# Patient Record
Sex: Female | Born: 1970 | Race: White | Hispanic: No | Marital: Married | Smoking: Never smoker
Health system: Southern US, Community
[De-identification: ages and names within clinical notes are randomized; demographics above are authoritative.]

## PROBLEM LIST (undated history)

## (undated) DIAGNOSIS — O039 Complete or unspecified spontaneous abortion without complication: Secondary | ICD-10-CM

## (undated) DIAGNOSIS — M1990 Unspecified osteoarthritis, unspecified site: Secondary | ICD-10-CM

## (undated) DIAGNOSIS — Z803 Family history of malignant neoplasm of breast: Secondary | ICD-10-CM

## (undated) DIAGNOSIS — N809 Endometriosis, unspecified: Secondary | ICD-10-CM

## (undated) DIAGNOSIS — D0512 Intraductal carcinoma in situ of left breast: Secondary | ICD-10-CM

## (undated) DIAGNOSIS — I10 Essential (primary) hypertension: Secondary | ICD-10-CM

## (undated) DIAGNOSIS — O1490 Unspecified pre-eclampsia, unspecified trimester: Secondary | ICD-10-CM

## (undated) DIAGNOSIS — Z8041 Family history of malignant neoplasm of ovary: Secondary | ICD-10-CM

## (undated) HISTORY — DX: Family history of malignant neoplasm of breast: Z80.3

## (undated) HISTORY — PX: BIOPSY ENDOMETRIAL: PRO11

## (undated) HISTORY — DX: Complete or unspecified spontaneous abortion without complication: O03.9

## (undated) HISTORY — DX: Unspecified osteoarthritis, unspecified site: M19.90

## (undated) HISTORY — PX: WISDOM TOOTH EXTRACTION: SHX21

## (undated) HISTORY — DX: Intraductal carcinoma in situ of left breast: D05.12

## (undated) HISTORY — DX: Essential (primary) hypertension: I10

## (undated) HISTORY — DX: Unspecified pre-eclampsia, unspecified trimester: O14.90

## (undated) HISTORY — DX: Endometriosis, unspecified: N80.9

## (undated) HISTORY — DX: Family history of malignant neoplasm of ovary: Z80.41

---

## 2002-02-17 HISTORY — PX: CHOLECYSTECTOMY: SHX55

## 2004-05-15 ENCOUNTER — Ambulatory Visit: Payer: Self-pay | Admitting: Internal Medicine

## 2005-06-19 ENCOUNTER — Ambulatory Visit: Payer: Self-pay | Admitting: Obstetrics & Gynecology

## 2005-08-28 ENCOUNTER — Ambulatory Visit: Payer: Self-pay | Admitting: Obstetrics & Gynecology

## 2006-03-09 ENCOUNTER — Ambulatory Visit: Payer: Self-pay | Admitting: Family Medicine

## 2006-07-23 ENCOUNTER — Encounter (INDEPENDENT_AMBULATORY_CARE_PROVIDER_SITE_OTHER): Payer: Self-pay | Admitting: Gynecology

## 2006-07-23 ENCOUNTER — Ambulatory Visit: Payer: Self-pay | Admitting: Obstetrics & Gynecology

## 2007-08-19 ENCOUNTER — Ambulatory Visit: Payer: Self-pay | Admitting: Obstetrics & Gynecology

## 2007-09-15 ENCOUNTER — Emergency Department (HOSPITAL_COMMUNITY): Admission: EM | Admit: 2007-09-15 | Discharge: 2007-09-15 | Payer: Self-pay | Admitting: Emergency Medicine

## 2008-02-14 ENCOUNTER — Encounter: Payer: Self-pay | Admitting: Family Medicine

## 2008-02-14 ENCOUNTER — Ambulatory Visit: Payer: Self-pay | Admitting: Family Medicine

## 2009-08-15 ENCOUNTER — Ambulatory Visit: Payer: Self-pay | Admitting: Obstetrics & Gynecology

## 2009-08-22 ENCOUNTER — Encounter: Payer: Self-pay | Admitting: Obstetrics & Gynecology

## 2009-08-22 ENCOUNTER — Ambulatory Visit: Payer: Self-pay | Admitting: Family Medicine

## 2009-08-22 LAB — CONVERTED CEMR LAB
HDL: 43 mg/dL (ref >39)
LDL Cholesterol: 110 mg/dL — ABNORMAL HIGH (ref 0–99)
TSH: 0.95 microintl units/mL (ref 0.350–4.500)
VLDL: 34 mg/dL (ref 0–40)

## 2010-02-22 ENCOUNTER — Ambulatory Visit: Payer: Self-pay | Admitting: Internal Medicine

## 2010-07-02 NOTE — Assessment & Plan Note (Signed)
NAMERUQAYYA, VENTRESS NO.:  [id number]   MEDICAL RECORD NO.:  [id number]          PATIENT TYPE:  POB   LOCATION:  [facility] at [facility]         FACILITY:  [facility]   PHYSICIAN:  Elsie Lincoln, MD      DATE OF BIRTH:  [birth year]   DATE OF SERVICE:                                  CLINIC NOTE   HISTORY OF PRESENT ILLNESS:  The patient is a 40 year old female who  presents for vaginal irritation.  She has been having pain and  irritation with sex for 2 months and then externally she is having  irritation for about a week.  There is itching.  There is no odor and no  discharge.   PAST MEDICAL HISTORY:  Denies all problems.   PAST GYN HISTORY:  Pelvic endometriosis.  Uses condoms for birth  control.   PAST SURGICAL HISTORY:  Laparoscopy for endometriosis, D&C, and a C-  section.   MEDICATIONS:  None.   ALLERGIES:  Penicillin and erythromycin.   PHYSICAL EXAMINATION:  VITAL SIGNS:  Pulse 74, blood pressure 120/80,  weight 211.  GENERAL:  Well nourished, well developed, in no apparent distress.  ABDOMEN:  Soft, nontender, and slightly obese.  GENITALIA:  Completely shaved and mild red erythema between 6 and 11  o'clock near the vestibule and vagina.  Discharge from the cervix  consistent with ovulation and the patient states she is ovulating.  Cervix closed.  Nontender.  No evidence of yeast inside the vagina.   ASSESSMENT AND PLAN:  A 40 year old female with vaginal irritation.  1. Apply Monistat to the outside of the vulva daily for a week.  2. Stop shaving till she returns for yearly exam to see if it helps      irritation.  3. Hydrocortisone p.r.n. and Monistat.  4. GC, Chlamydia, and wet prep present.  5. Diflucan to treat for external yeast.  6. Return to the clinic for yearly exam in 2 weeks.           ______________________________  Elsie Lincoln, MD     KL/MEDQ  D:  08/19/2007  T:  08/20/2007  Job:  [id number]

## 2010-07-02 NOTE — Assessment & Plan Note (Signed)
NAMEREBECKAH, Jane [name] NO.:  [id number]   MEDICAL RECORD NO.:  [id number]          PATIENT TYPE:  POB   LOCATION:  [facility]         FACILITY:  [facility]   PHYSICIAN:  Jaynie Collins, MD     DATE OF BIRTH:  Jun 03, 1970   DATE OF SERVICE:  08/15/2009                                  CLINIC NOTE   REASON FOR VISIT:  Yearly examination.   The patient is a 40 year old gravida 2, para 1-0-1-1 who comes in today  for her yearly examination.  The patient denies any gynecologic concerns  or issues.   PAST OB/GYN HISTORY:  She is a gravida 2, para 1-0-1-1, one cesarean  section and one miscarriage requiring a D&C.  She has a history of  endometriosis which are managed with NSAIDs and she had been on oral  contraceptive pills in the past but none currently.  No history of  abnormal Pap smears or sexually transmitted infections.  Her last Pap  smear was in December 2009.   PAST MEDICAL HISTORY:  Endometriosis and pinched nerve in her back.   PAST SURGICAL HISTORY:  Laparoscopy for her endometriosis, laparoscopic  cholecystectomy, a D&C for miscarriage, and an emergent cesarean section  via an infraumbilical vertical incision.   MEDICATIONS:  Vitamin D.   ALLERGIES:  PENICILLIN, ERYTHROMYCIN which both cause hives and ADHESIVE  which causes blisters and infection.   SOCIAL HISTORY:  The patient is a Agricultural consultant for [employer].  She denies  any tobacco, alcohol, or illicit drug use.  She denies any past or  current history of sexual abuse or physical abuse.   FAMILY HISTORY:  She has a paternal aunt that died of ovarian cancer.  Another paternal aunt who survived postmenopausal breast cancer.  There  is a maternal grandfather with a history of colon cancer and heart  disease and she denies any other family conditions or diseases.   REVIEW OF SYSTEMS:  A 14-point review of systems is discussed with the  patient and is entirely negative.   PHYSICAL EXAMINATION:   VITAL SIGNS:  Blood pressure 129/85, pulse 66,  weight 210 pounds, height 5 feet 2 inches.  GENERAL:  No apparent distress.  HEENT:  Normocephalic, atraumatic.  NECK:  Supple.  No masses.  Normal thyroid.  LUNGS:  Clear to auscultation bilaterally.  HEART:  Regular rate and rhythm.  BREASTS:  Symmetric, soft, nontender.  No abnormal skin changes, masses,  nipple drainage, or lymphadenopathy.  ABDOMEN:  Soft, nontender, nondistended.  No organomegaly.  EXTREMITIES:  No cyanosis, clubbing, or edema.  Distal pulses are 2+.  PELVIC:  Normal external female genitalia.  Pink, well-rugated vagina.  Nulliparous cervical os without lesion.  Uterus is small and anteverted.  No tenderness on examination.  No adnexal tenderness but adnexa were  unable to be palpated secondary to patient's habitus.   ASSESSMENT AND PLAN:  The patient is a 40 year old gravida 2, para 1-0-1-  1 here for her annual examination.  She had a normal breast examination.  We will obtain a routine mammogram today and this appointment will be  made with the patient after this encounter.  She was told that she  can  get mammograms every 2 years as needed until she is 49 at which point,  we will get yearly mammograms.  The patient does have a history of  endometriosis but is not requiring any further intervention at this  point.  She says that the occasional analgesia helps for her pain.  The  patient will come back at a later date to have her fasting lipid profile  and TSH checked, and we will follow up on these results.  The patient  was told to call back for any further gynecologic concerns.           ______________________________  Jaynie Collins, MD     UA/MEDQ  D:  08/15/2009  T:  08/16/2009  Job:  [id number]

## 2010-07-05 NOTE — Group Therapy Note (Signed)
Jane [name], VACCARO NO.:  [id number]   MEDICAL RECORD NO.:  [id number]          PATIENT TYPE:  POB   LOCATION:  [facility]                   FACILITY:  [facility]   PHYSICIAN:  Elsie Lincoln, MD      DATE OF BIRTH:  [birth year]   DATE OF SERVICE:  08/28/2005                                    CLINIC NOTE   This patient was seen at [facility].   The patient is here for followup of her results.   The patient has probable endometriosis and was having increased pelvic pain.  We started her on Loestrin 24 her last visit, however, she is having a lot  of bleeding with this so we are going to up her estrogen and also change her  progestin secondary to breast complaints. We have given her sample of Yasmin  and also some prescriptions. She also was having nausea so she changed to  nighttime dosing and is doing better with that. She has a family history of  rheumatoid arthritis and is having increasing stiffness. She is not going to  the rheumatologist yet as she is waiting for her new insurance; however, her  rheumatoid factor was less than 20. She is not due for a mammogram and her  Pap smear in May was negative. The patient is to followup in 6 months to see  how she is doing with her pelvic pain and see how the new Yasmin is doing  with her bleeding and pain. If she needs to be sooner, she will make an  appointment.           ______________________________  Elsie Lincoln, MD     KL/MEDQ  D:  08/28/2005  T:  08/28/2005  Job:  [id number]

## 2010-07-05 NOTE — Group Therapy Note (Signed)
[name], [name] NO.:  [id number]   MEDICAL RECORD NO.:  [id number]          PATIENT TYPE:  WOC   LOCATION:  [facility]                   FACILITY:  [facility]   PHYSICIAN:  Elsie Lincoln, MD      DATE OF BIRTH:  10/21/1970   DATE OF SERVICE:  06/19/2005                                    CLINIC NOTE   The patient is a 40 year old G2 para 1-0-1-1, LMP 05/30/2005, who presents  for her annual exam.  She also has a new complaint in the past year of  increased pain with menstrual cycles and also dyspareunia.  She has always  experienced cramping with the first day of flow; however, this past year,  she has had some brownish discharge a couple of days before and then  cramping on the first day, and on the second and third day had increased  cramping.  The pain is relieved with Motrin; however, this is different for  her.  She is also having pain after intercourse.  She just has a dull ache  deep inside after intercourse.  She has been diagnosed by laparoscopy in the  1990s with endometriosis.  She believes that this is back.  She was  controlled with continuous OCPs back in the 1990s for her endometriosis and  thinks this would be a good choice for her again.  She also complains of  increased bleeding and clots while on her menstrual flow, and the clots are  painful as they pass.  Currently, she uses condoms for birth control and her  husband is to get a vasectomy.   MEDICAL HISTORY:  She has increased arthritic pain, and her grandmother was  diagnosed with rheumatoid arthritis.  She also thinks her father may have it  as well.  It is reasonable for her to get a rheumatology consult to see if  she has this as well.  She has increased stiffness in her hands and  shoulders, and it takes a while and need for heat in order for her joints to  become pliable.   SURGICAL HISTORY:  No change.   ALLERGIES:  Penicillin, amoxicillin, erythromycin, and adhesive.   Last Pap  smear 05/13/2004.  Last mammogram 03/09/2002.   LIMITATIONS:  None.   REVIEW OF SYMPTOMS:  Positive for dyspareunia.   PHYSICAL EXAMINATION:  GENERAL:  Well nourished, well developed, no apparent  distress.  VITAL SIGNS:  Pulse 67, blood pressure 127/86, weight 205.  HEENT:  Normocephalic, atraumatic.  Oropharynx is clear.  THYROID:  No masses.  LUNGS:  Clear to auscultation bilaterally.  HEART:  Regular rate and rhythm.  BREASTS:  No masses.  No nipple discharge or skin changes.  No  lymphadenopathy.  ABDOMEN:  Soft, nontender, nondistended.  No organomegaly.  No hernia.  No  lymphadenopathy.  GENITALIA:  Tanner V.  Vagina pink, normal rugae.  Urethra:  No prolapse or  tenderness.  Bladder nontender.  Cervix:  Nulliparous, closed, nontender.  Uterus difficult to palpate secondary to body habitus; however, nontender.  Adnexa:  No masses, nontender.  Perineum intact.  EXTREMITIES:  No edema.   ASSESSMENT  AND PLAN:  A 40 year old female for her physical.  1.  Pap smear.  2.  Dysmenorrhea and dyspareunia probably secondary to endometriosis.  We      will get a transvaginal ultrasound to rule out any pelvic pathology, but      in the meantime, we will start her on continuous OCPs.  The patient was      given a sample of Loestrin Fe 24 and she is to throw out the placebo.      She was also given a prescription for a year's supply.  3.  Rheumatology referral.  4.  Return to clinic in 8 weeks to go over results.           ______________________________  Elsie Lincoln, MD     KL/MEDQ  D:  06/19/2005  T:  06/19/2005  Job:  [id number]

## 2010-09-11 ENCOUNTER — Ambulatory Visit: Payer: Self-pay | Admitting: Family Medicine

## 2010-11-15 LAB — URINALYSIS, ROUTINE W REFLEX MICROSCOPIC
Bilirubin Urine: NEGATIVE
Nitrite: NEGATIVE
Protein, ur: NEGATIVE
Specific Gravity, Urine: 1.012
Urobilinogen, UA: 0.2

## 2011-01-07 ENCOUNTER — Ambulatory Visit: Payer: Self-pay | Admitting: Internal Medicine

## 2012-11-17 ENCOUNTER — Ambulatory Visit: Payer: Self-pay | Admitting: Obstetrics and Gynecology

## 2013-11-21 ENCOUNTER — Ambulatory Visit: Payer: Self-pay | Admitting: Obstetrics and Gynecology

## 2014-01-20 ENCOUNTER — Ambulatory Visit: Payer: Self-pay | Admitting: Emergency Medicine

## 2014-04-18 IMAGING — US ULTRASOUND RIGHT BREAST
1 series · 5 of 5 positions shown · non-contrast
Comparison: No comparison exams available (patient reportedly has
her screening mammograms done at an outside facility).

ACR Breast Density Category c: The breast tissue is heterogeneously
dense.

REASON FOR EXAM: RT BR FIRMNESS AREOLA
COMMENTS:

PROCEDURE:     MAM - MAM US BREAST RIGHT  - November 17, 2012  [DATE]
CLINICAL DATA: Palpable right breast mass.
EXAM:
DIGITAL DIAGNOSTIC rightMAMMOGRAM
ULTRASOUND right BREAST

[Series 1: ultrasound right breast · 0.08mm/px · 5 of 5 slices shown]
[im 1/5]
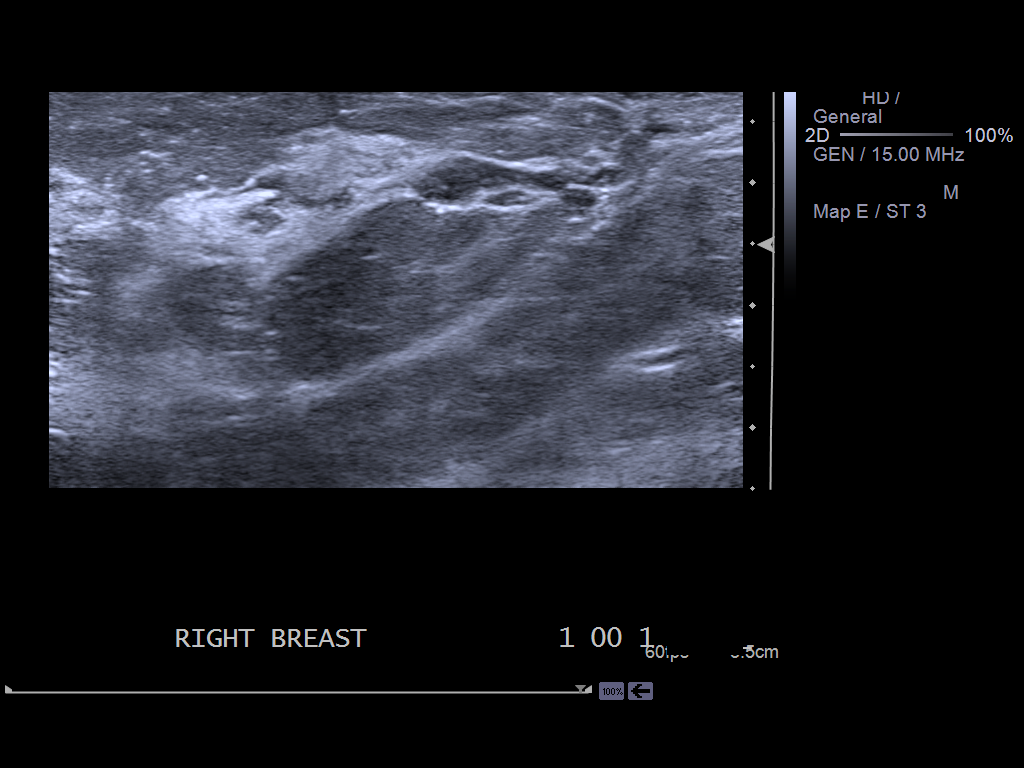
[im 2/5]
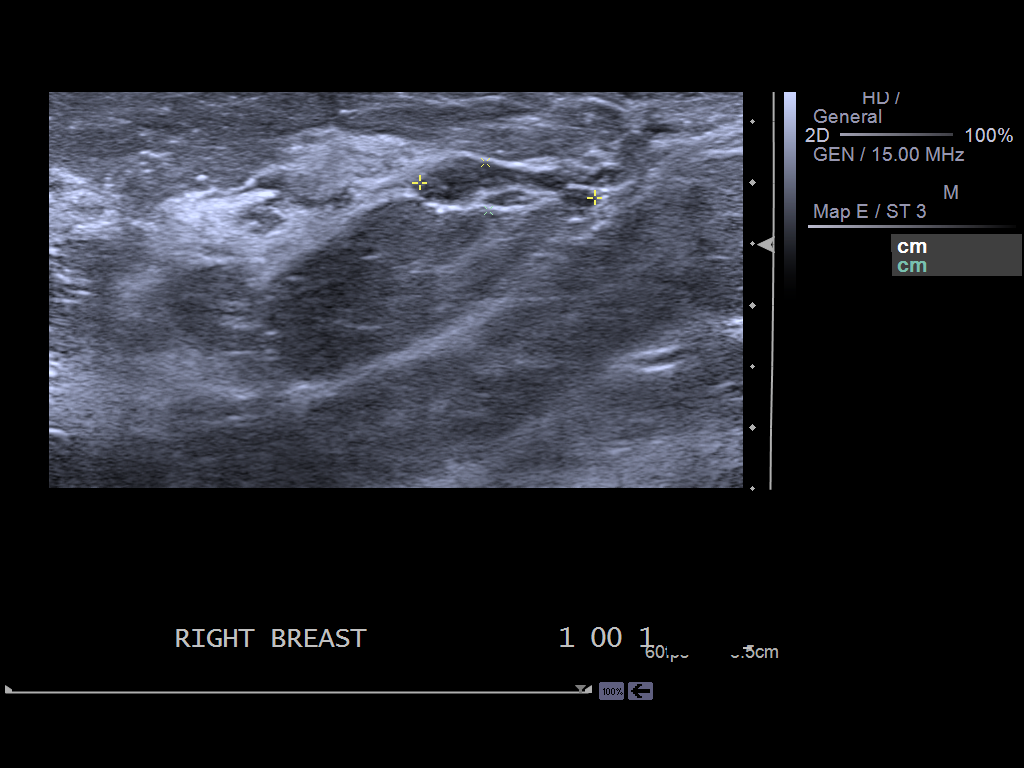
[im 3/5]
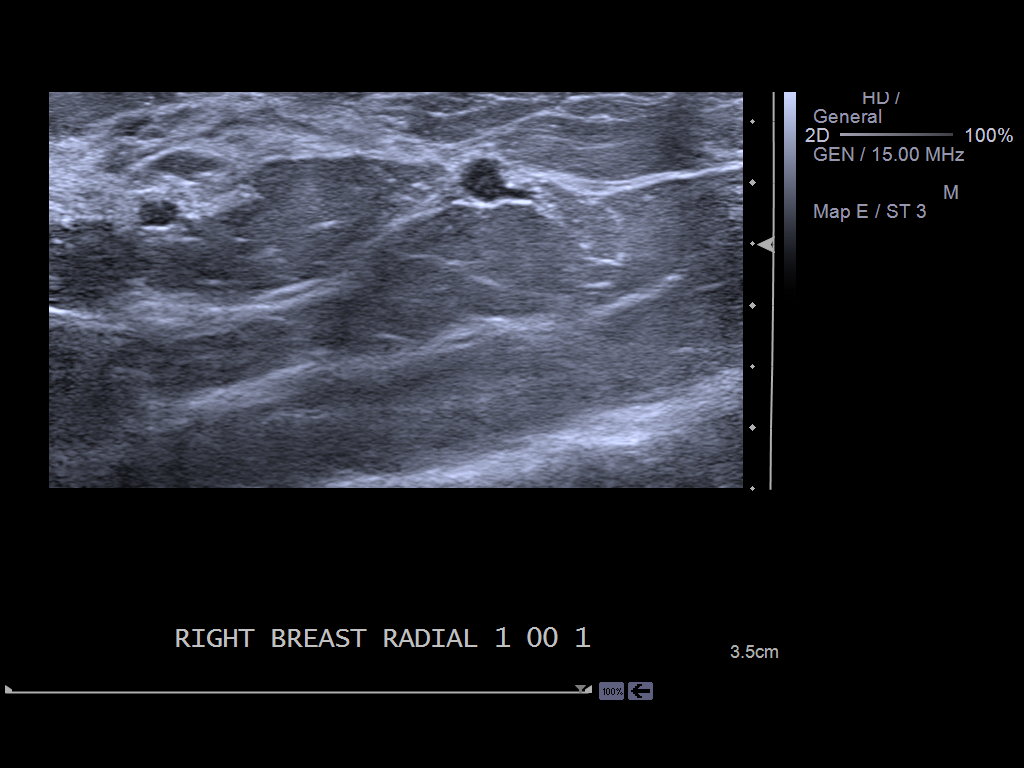
[im 4/5]
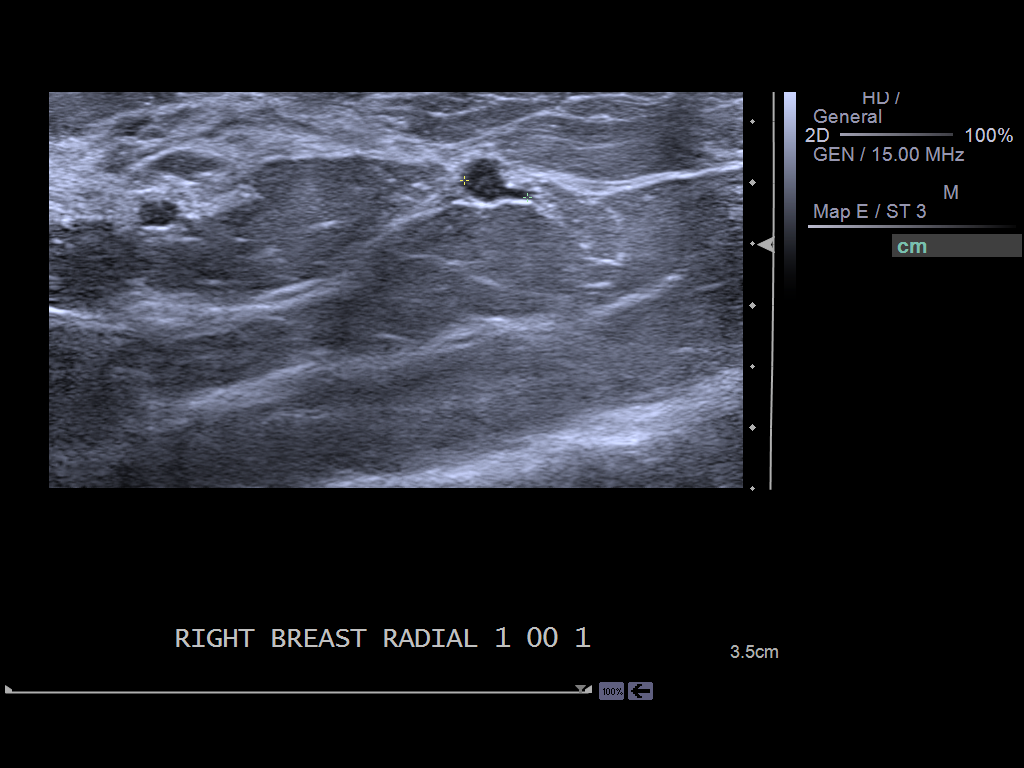
[im 5/5]
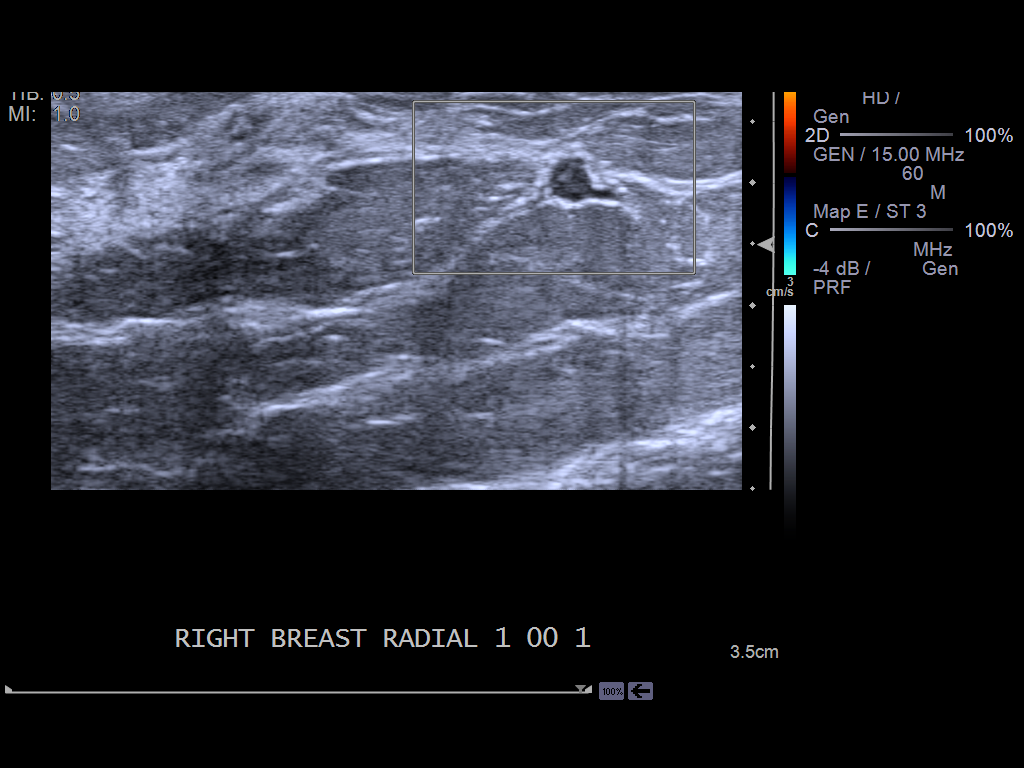

[5 of 5 positions shown; findings below may reference images not displayed]

FINDINGS: No suspicious masses or calcifications are seen in the right breast.
No mammographic abnormalities are seen to correlate with the area of
palpable concern in the right breast.

Mammographic images were processed with CAD.

Physical examination at site of palpable concern in the periareolar
right breast at the approximate 12 to 1 o'clock position does not
reveal any palpable masses.

Targeted ultrasound of the right breast was performed demonstrating
a tubular hypoechoic mass in the right breast at 1 o'clock 1 cm from
the nipple measuring 1.4 x 0.4 x 0.5 cm.
IMPRESSION: Probably benign right breast mass, possibly related to a mildly
dilated duct, less likely an intramammary lymph node.

RECOMMENDATION:
A six-month followup right breast ultrasound is recommended to
demonstrate stability of the probably benign periareolar right
breast mass.

I have discussed the findings and recommendations with the patient.
Results were also provided in writing at the conclusion of the
visit. If applicable, a reminder letter will be sent to the patient
regarding the next appointment.

BI-RADS CATEGORY  3: Probably benign finding(s) - short interval
follow-up suggested.

## 2015-01-18 ENCOUNTER — Other Ambulatory Visit: Payer: Self-pay | Admitting: Obstetrics and Gynecology

## 2015-01-18 DIAGNOSIS — N63 Unspecified lump in unspecified breast: Secondary | ICD-10-CM

## 2015-02-01 ENCOUNTER — Other Ambulatory Visit: Payer: Self-pay

## 2015-02-01 ENCOUNTER — Ambulatory Visit: Payer: Self-pay

## 2015-04-03 LAB — HM MAMMOGRAPHY

## 2015-04-03 LAB — HM PAP SMEAR: HM Pap smear: NEGATIVE

## 2015-04-22 IMAGING — US US BREAST*R* LIMITED INC AXILLA
1 series · 4 of 4 positions shown · non-contrast
Comparison: Right diagnostic mammogram dated 11/17/2012.

CLINICAL DATA: 43-year-old female for follow-up of probably benign
right breast finding

EXAM:
DIGITAL DIAGNOSTIC  BILATERAL MAMMOGRAM WITH CAD
ULTRASOUND RIGHT BREAST

[Series 1: us breast*right* limited inc axilla · 0.08mm/px · 4 of 4 slices shown]
[im 1/4]
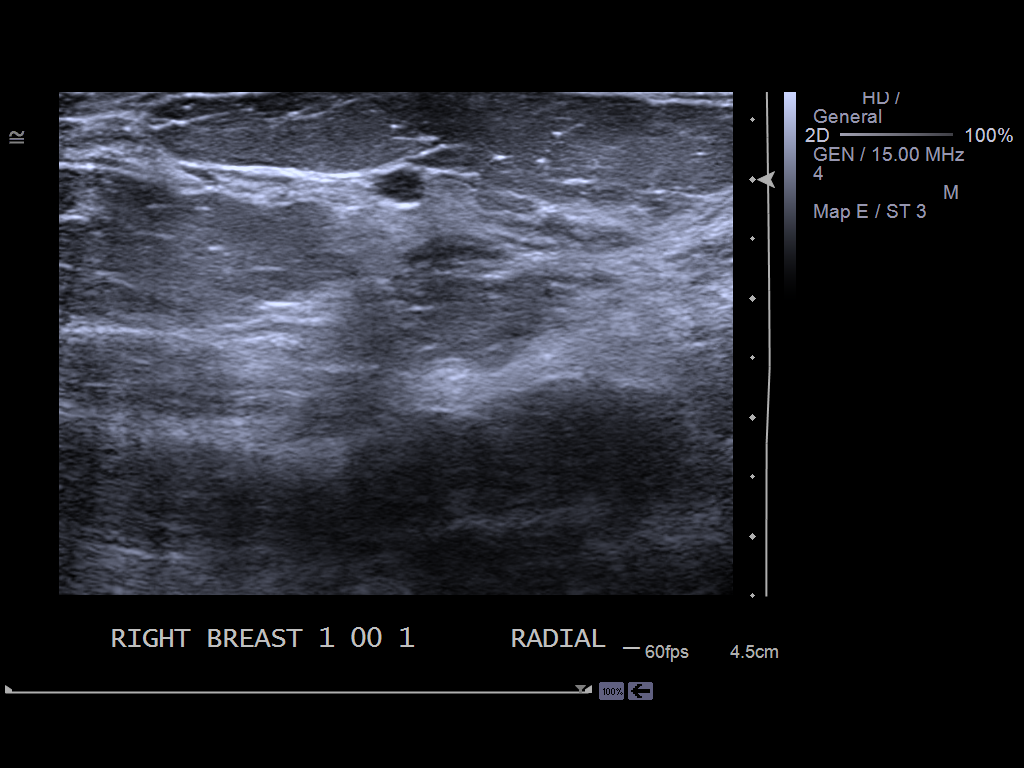
[im 2/4]
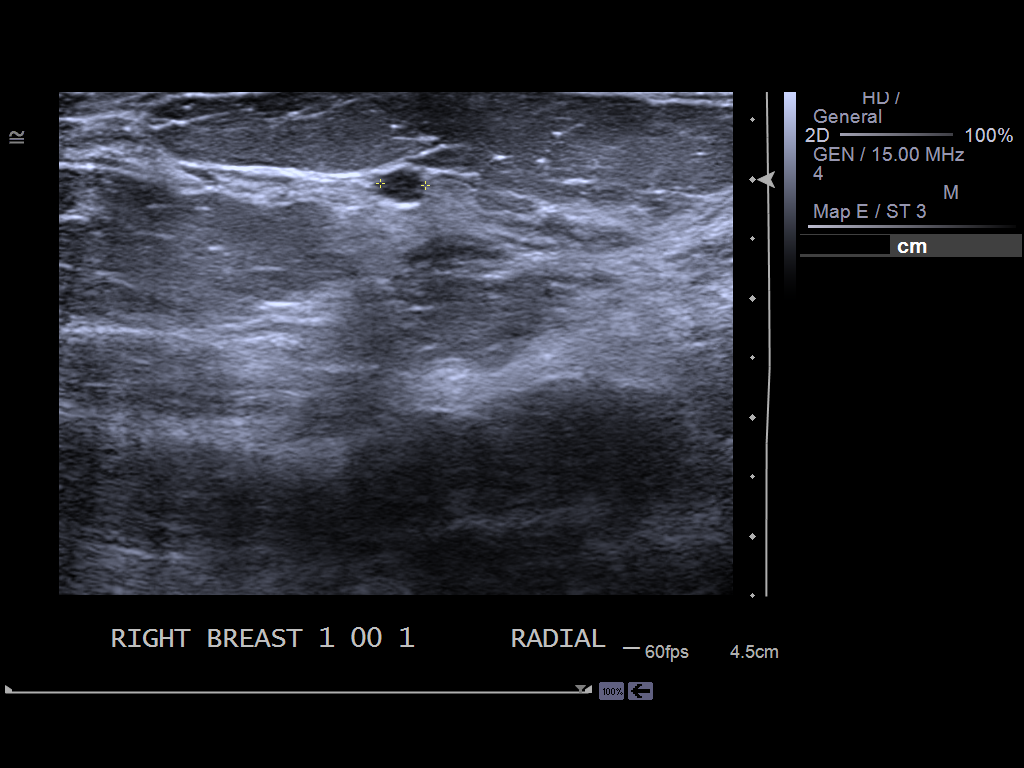
[im 3/4]
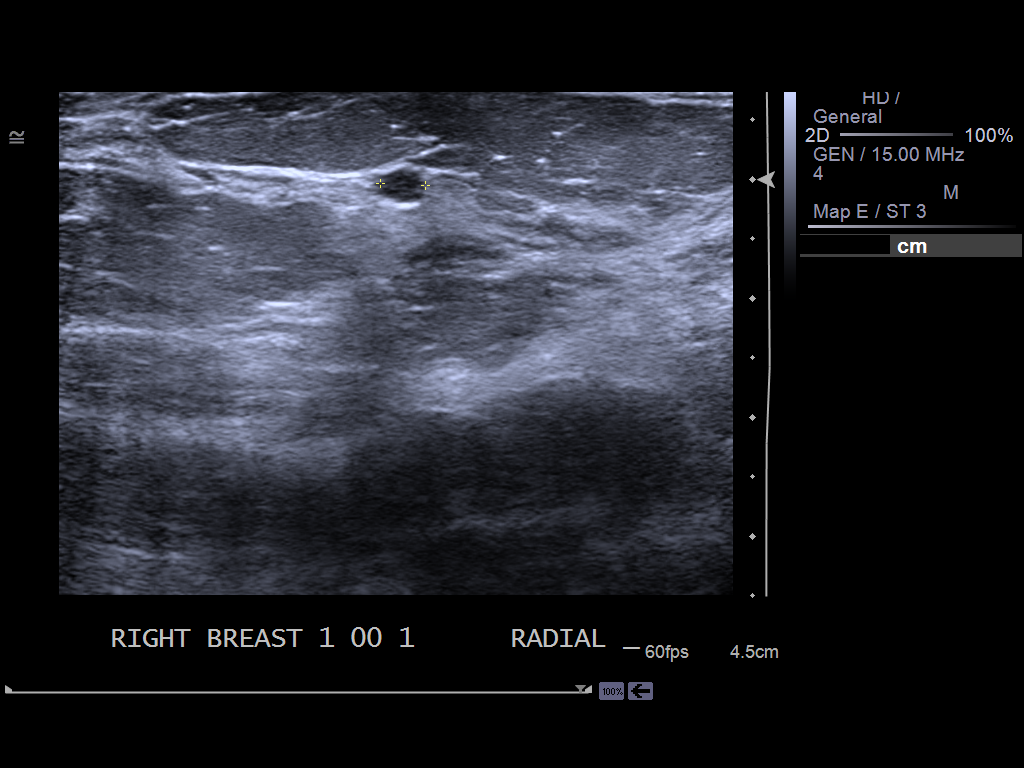
[im 4/4]
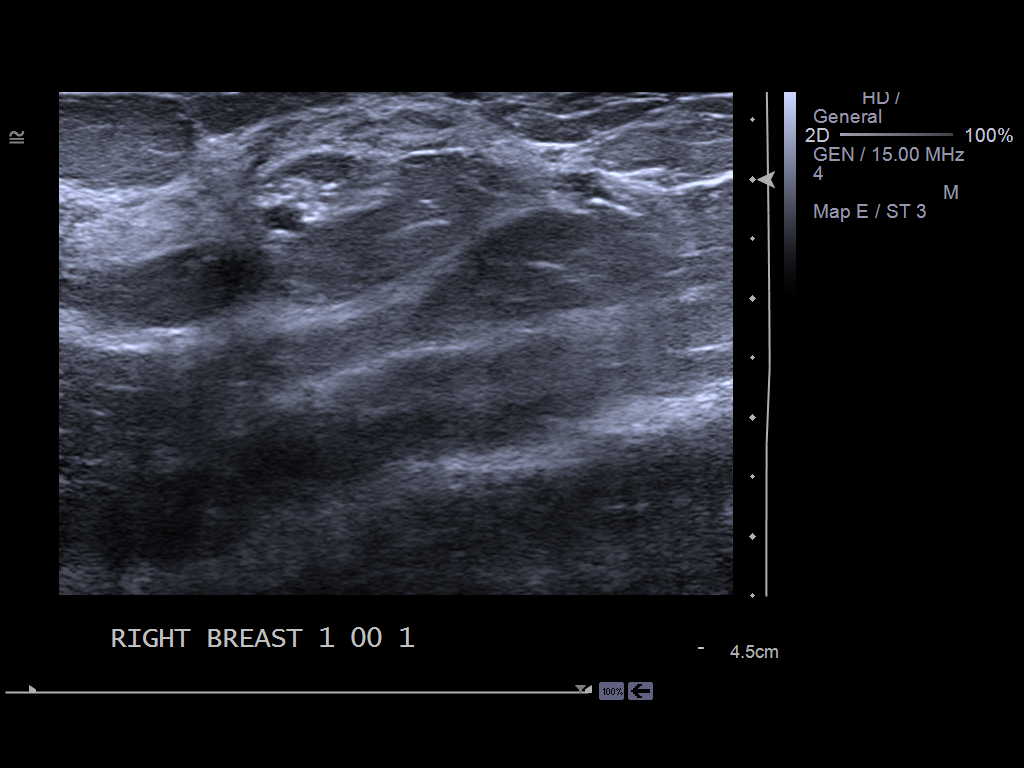

[4 of 4 positions shown; findings below may reference images not displayed]

Outside
screening exams dated 05/04/2013 and 01/30/2012. These images were
not available at the time of the patient's exam.

ACR Breast Density Category c: The breast tissue is heterogeneously
dense, which may obscure small masses.
FINDINGS: Targeted ultrasound of the region of the previously seen hypoechoic
mass at 1 o'clock, 1 cm from the nipple demonstrates no significant
interval change in sonographic appearance of this region. Precise
comparison is difficult due to the tubular nature of the mass and
differences in imaging technique.

An asymmetry is noted within the subareolar right breast on the CC
spot compression view only. This appears less prominent on the
rolled CC views. Targeted ultrasound of the central right breast
demonstrates no suspicious cystic or solid sonographic finding. No
definite sonographic correlate is identified.

Mammographic images were processed with CAD.
IMPRESSION: Probably benign right breast asymmetry and mass at 1 o'clock, 1 cm
from the nipple.

RECOMMENDATION:
Right diagnostic mammogram and ultrasound in 6 months.

I have discussed the findings and recommendations with the patient.
Results were also provided in writing at the conclusion of the
visit. If applicable, a reminder letter will be sent to the patient
regarding the next appointment.

BI-RADS CATEGORY  3: Probably benign.

## 2016-11-12 ENCOUNTER — Ambulatory Visit: Payer: Self-pay | Admitting: Advanced Practice Midwife

## 2016-11-28 ENCOUNTER — Encounter: Payer: Self-pay | Admitting: Obstetrics and Gynecology

## 2016-11-28 ENCOUNTER — Ambulatory Visit (INDEPENDENT_AMBULATORY_CARE_PROVIDER_SITE_OTHER): Payer: Self-pay | Admitting: Obstetrics and Gynecology

## 2016-11-28 VITALS — BP 128/88 | Ht 61.0 in | Wt 224.0 lb

## 2016-11-28 DIAGNOSIS — N914 Secondary oligomenorrhea: Secondary | ICD-10-CM

## 2016-11-28 DIAGNOSIS — N760 Acute vaginitis: Secondary | ICD-10-CM

## 2016-11-28 LAB — POCT WET PREP WITH KOH
CLUE CELLS WET PREP PER HPF POC: NEGATIVE
KOH Prep POC: NEGATIVE
PH, VAGINAL: 4.5
Trichomonas, UA: NEGATIVE

## 2016-11-28 MED ORDER — MEDROXYPROGESTERONE ACETATE 10 MG PO TABS: 10.0000 mg | ORAL_TABLET | Freq: Every day | ORAL | 0 refills | Status: DC

## 2016-11-28 NOTE — Progress Notes (Signed)
Obstetrics & Gynecology Office Visit   Chief Complaint  Patient presents with  . Vaginal Discharge    History of Present Illness: Vaginitis: Patient complains of an abnormal vaginal discharge for 1 week. Vaginal symptoms include discharge described as mucusy and odor.Vulvar symptoms include burning and local irritation.STI Risk: Very low risk of STD exposureDischarge described as: mucoid.Other associated symptoms: none.Menstrual pattern: She had been bleeding infrequently. Contraception: none. She has had no menses since July. She has about 2 menses per year. When they do come they are quite heavy. She does endorse some symptoms of menopause such as hot flashes.   Past Medical History:  Diagnosis Date  . Arthritis   . Endometriosis   . Hypertension   . Pre-eclampsia   . Spontaneous abortion    Past surgical history: C-section Cholecystectomy Diagnostic laparoscopy  Gynecologic History: Patient's last menstrual period was 07/29/2016.  Obstetric History: [id number]  Family History  Problem Relation Age of Onset  . Kidney disease Father   . Breast cancer Maternal Aunt   . Ovarian cancer Maternal Aunt   . Colon cancer Maternal Grandfather   . Heart disease Maternal Grandfather     Social History   Social History  . Marital status: Married    Spouse name: N/A  . Number of children: N/A  . Years of education: N/A   Occupational History  . Not on file.   Social History Main Topics  . Smoking status: Never Smoker  . Smokeless tobacco: Never Used  . Alcohol use No  . Drug use: No  . Sexual activity: Yes    Birth control/ protection: Surgical   Other Topics Concern  . Not on file   Social History Narrative  . No narrative on file    Allergies  Allergen Reactions  . Erythromycin Rash  . Penicillins Rash  . Amoxicillin Hives    Prior to Admission medications   Medication Sig Start Date End Date Taking? Authorizing Provider  lisinopril (PRINIVIL,ZESTRIL) 20 MG  tablet Take 20 mg by mouth daily. 11/19/16   [provider]    Review of Systems  Constitutional: Negative.   HENT: Negative.   Eyes: Negative.   Respiratory: Negative.   Cardiovascular: Negative.   Gastrointestinal: Negative.   Genitourinary: Negative.        See HPI  Musculoskeletal: Negative.   Skin: Negative.   Neurological: Negative.   Psychiatric/Behavioral: Negative.      Physical Exam BP 128/88   Ht  (1.549 m)   Wt 224 lb (101.6 kg)   LMP 07/29/2016   BMI 42.32 kg/m  Patient's last menstrual period was 07/29/2016. Physical Exam  Constitutional: She is oriented to person, place, and time. She appears well-developed and well-nourished. No distress.  Genitourinary: Vagina normal and uterus normal. Pelvic exam was performed with patient supine. There is no rash, tenderness or lesion on the right labia. There is no rash, tenderness or lesion on the left labia. Right adnexum does not display mass, does not display tenderness and does not display fullness. Left adnexum does not display mass, does not display tenderness and does not display fullness. Cervix does not exhibit motion tenderness, lesion, discharge or polyp.   Uterus is mobile. Uterus is not enlarged or tender.  Cardiovascular: Normal rate and regular rhythm.   Pulmonary/Chest: Effort normal and breath sounds normal.  Abdominal: Soft. Bowel sounds are normal. She exhibits no distension and no mass. There is no tenderness. There is no rebound and no guarding.  Musculoskeletal: Normal range of motion. She exhibits no edema or tenderness.  Neurological: She is alert and oriented to person, place, and time.  Skin: She is not diaphoretic.  Psychiatric: She has a normal mood and affect. Her behavior is normal. Judgment normal.   Female chaperone present for pelvic and breast  portions of the physical exam  Wet Prep: PH: 4.5 Clue Cells: Negative Fungal elements: Negative Trichomonas:  Negative  Assessment: 46 y.o. [id number] female here for  1. Acute vaginitis   2. Secondary oligomenorrhea      Plan: Problem List Items Addressed This Visit    None    Visit Diagnoses    Acute vaginitis    -  Primary   No evidence of BV, trichomonas, or yeast infection. Continue to monitor.   Relevant Orders   POCT Wet Prep with KOH (Completed)   Secondary oligomenorrhea       Provera 10 mg po x 10 days to affect withdrawal bleed.  If heavy, recommend she does this more often as she may have an estrogen deficiency. If lighter, no trx   Relevant Medications   medroxyPROGESTERone (PROVERA) 10 MG tablet     Thomasene Mohair, MD 11/28/2016 11:56 AM

## 2016-12-31 ENCOUNTER — Ambulatory Visit: Payer: Self-pay | Admitting: Obstetrics & Gynecology

## 2017-01-17 DIAGNOSIS — Z8041 Family history of malignant neoplasm of ovary: Secondary | ICD-10-CM

## 2017-01-17 HISTORY — DX: Family history of malignant neoplasm of ovary: Z80.41

## 2017-01-19 ENCOUNTER — Encounter: Payer: Self-pay | Admitting: Obstetrics & Gynecology

## 2017-01-19 ENCOUNTER — Ambulatory Visit (INDEPENDENT_AMBULATORY_CARE_PROVIDER_SITE_OTHER): Payer: BLUE CROSS/BLUE SHIELD | Admitting: Obstetrics & Gynecology

## 2017-01-19 VITALS — BP 102/60 | HR 87 | Ht 61.0 in | Wt 226.0 lb

## 2017-01-19 DIAGNOSIS — Z1231 Encounter for screening mammogram for malignant neoplasm of breast: Secondary | ICD-10-CM | POA: Diagnosis not present

## 2017-01-19 DIAGNOSIS — Z01419 Encounter for gynecological examination (general) (routine) without abnormal findings: Secondary | ICD-10-CM

## 2017-01-19 DIAGNOSIS — Z0000 Encounter for general adult medical examination without abnormal findings: Secondary | ICD-10-CM | POA: Insufficient documentation

## 2017-01-19 DIAGNOSIS — Z1239 Encounter for other screening for malignant neoplasm of breast: Secondary | ICD-10-CM

## 2017-01-19 NOTE — Progress Notes (Signed)
HPI:      [name] is a 46 y.o. [phone] who LMP was Patient's last menstrual period was 12/05/2016., she presents today for her annual examination. The patient has no complaints today. The patient is sexually active. Her last pap: approximate date 2017 (Feb) and was normal and last mammogram: approximate date 2017 and was normal. The patient does perform self breast exams.  There is notable family history of breast or ovarian cancer in her family.  The patient has regular exercise: yes.  The patient denies current symptoms of depression.   Last period April 2018; took Provera in Oct to see if would have period and it was painful and not desirable.  No BTB. Rare hot flash.  C/o WEIGHT GAIn, FATIGUE, and MEMORY LAPSES.  GYN History: Contraception: vasectomy  PMHx: Past Medical History:  Diagnosis Date  . Arthritis   . Endometriosis   . Hypertension   . Pre-eclampsia   . Spontaneous abortion    History reviewed. No pertinent surgical history. Family History  Problem Relation Age of Onset  . Kidney disease Father   . Breast cancer Maternal Aunt   . Ovarian cancer Maternal Aunt   . Colon cancer Maternal Grandfather   . Heart disease Maternal Grandfather    Social History   Tobacco Use  . Smoking status: Never Smoker  . Smokeless tobacco: Never Used  Substance Use Topics  . Alcohol use: No  . Drug use: No    Current Outpatient Medications:  .  lisinopril (PRINIVIL,ZESTRIL) 20 MG tablet, Take 20 mg by mouth daily., Disp: , Rfl: 3 .  medroxyPROGESTERone (PROVERA) 10 MG tablet, Take 1 tablet (10 mg total) by mouth daily., Disp: 10 tablet, Rfl: 0 Allergies: Erythromycin; Penicillins; and Amoxicillin  Review of Systems  Constitutional: Negative for chills, fever and malaise/fatigue.  HENT: Negative for congestion, sinus pain and sore throat.   Eyes: Negative for blurred vision and pain.  Respiratory: Negative for cough and wheezing.   Cardiovascular: Negative for  chest pain and leg swelling.  Gastrointestinal: Negative for abdominal pain, constipation, diarrhea, heartburn, nausea and vomiting.  Genitourinary: Negative for dysuria, frequency, hematuria and urgency.  Musculoskeletal: Negative for back pain, joint pain, myalgias and neck pain.  Skin: Negative for itching and rash.  Neurological: Negative for dizziness, tremors and weakness.  Endo/Heme/Allergies: Does not bruise/bleed easily.  Psychiatric/Behavioral: Negative for depression. The patient is not nervous/anxious and does not have insomnia.     Objective: BP 102/60   Pulse 87   Ht 5\' 1"  (1.549 m)   Wt 226 lb (102.5 kg)   LMP 12/05/2016   BMI 42.70 kg/m   Filed Weights   01/19/17 1532  Weight: 226 lb (102.5 kg)   Body mass index is 42.7 kg/m. Physical Exam  Constitutional: She is oriented to person, place, and time. She appears well-developed and well-nourished. No distress.  Genitourinary: Rectum normal, vagina normal and uterus normal. Pelvic exam was performed with patient supine. There is no rash or lesion on the right labia. There is no rash or lesion on the left labia. Vagina exhibits no lesion. No bleeding in the vagina. Right adnexum does not display mass and does not display tenderness. Left adnexum does not display mass and does not display tenderness. Cervix does not exhibit motion tenderness, lesion, friability or polyp.   Uterus is mobile and midaxial. Uterus is not enlarged or exhibiting a mass.  HENT:  Head: Normocephalic and atraumatic. Head is without laceration.  Right Ear: Hearing normal.  Left Ear: Hearing normal.  Nose: No epistaxis.  No foreign bodies.  Mouth/Throat: Uvula is midline, oropharynx is clear and moist and mucous membranes are normal.  Eyes: Pupils are equal, round, and reactive to light.  Neck: Normal range of motion. Neck supple. No thyromegaly present.  Cardiovascular: Normal rate and regular rhythm. Exam reveals no gallop and no friction rub.    No murmur heard. Pulmonary/Chest: Effort normal and breath sounds normal. No respiratory distress. She has no wheezes. Right breast exhibits no mass, no skin change and no tenderness. Left breast exhibits no mass, no skin change and no tenderness.  Abdominal: Soft. Bowel sounds are normal. She exhibits no distension. There is no tenderness. There is no rebound.  Musculoskeletal: Normal range of motion.  Neurological: She is alert and oriented to person, place, and time. No cranial nerve deficit.  Skin: Skin is warm and dry.  Psychiatric: She has a normal mood and affect. Judgment normal.  Vitals reviewed.   Assessment:  ANNUAL EXAM 1. Screening for breast cancer   2. Annual physical exam   3. Morbid obesity (HCC)     Screening Plan:            1.  Cervical Screening-  Pap smear schedule reviewed with patient  2. Breast screening- Exam annually and mammogram>40 planned   3. Colonoscopy every 10 years, Hemoccult testing - after age 47  4. Labs managed by PCP  5. Counseling for contraception: vasectomy   6. Morbid obesity (HCC) Weight loss counseled along w exercise and dietary changes.  Meds to consider.   7. Oligomenorrhea, monitor as may be menopause.  If BTB then consider US or EMB.    F/U  Return in about 1 year (around 01/19/2018) for Annual.  Annamarie MajorPaul Emoni Yang, MD, Merlinda FrederickFACOG [facility], [facility] 01/19/2017  4:07 PM

## 2017-01-19 NOTE — Patient Instructions (Signed)
PAP every three years Mammogram every year    Call [phone] to schedule at [place] Colonoscopy every 10 years after age 47 Labs yearly (with PCP) 

## 2017-02-04 ENCOUNTER — Encounter: Payer: Self-pay | Admitting: Obstetrics and Gynecology

## 2017-03-04 ENCOUNTER — Other Ambulatory Visit: Payer: Self-pay | Admitting: *Deleted

## 2017-03-04 ENCOUNTER — Inpatient Hospital Stay
Admission: RE | Admit: 2017-03-04 | Discharge: 2017-03-04 | Disposition: A | Discharge status: Home / Self Care | Payer: Self-pay | Source: Ambulatory Visit | Attending: *Deleted | Admitting: *Deleted

## 2017-03-04 DIAGNOSIS — Z9289 Personal history of other medical treatment: Secondary | ICD-10-CM

## 2017-03-30 ENCOUNTER — Ambulatory Visit
Admission: RE | Admit: 2017-03-30 | Discharge: 2017-03-30 | Disposition: A | Discharge status: Home / Self Care | Payer: BLUE CROSS/BLUE SHIELD | Source: Ambulatory Visit | Attending: Obstetrics & Gynecology | Admitting: Obstetrics & Gynecology

## 2017-03-30 ENCOUNTER — Other Ambulatory Visit: Payer: Self-pay | Admitting: Obstetrics & Gynecology

## 2017-03-30 ENCOUNTER — Encounter: Payer: Self-pay | Admitting: Radiology

## 2017-03-30 DIAGNOSIS — Z1239 Encounter for other screening for malignant neoplasm of breast: Secondary | ICD-10-CM

## 2017-03-30 DIAGNOSIS — Z1231 Encounter for screening mammogram for malignant neoplasm of breast: Secondary | ICD-10-CM | POA: Diagnosis present

## 2017-03-30 DIAGNOSIS — R921 Mammographic calcification found on diagnostic imaging of breast: Secondary | ICD-10-CM

## 2017-03-30 DIAGNOSIS — R928 Other abnormal and inconclusive findings on diagnostic imaging of breast: Secondary | ICD-10-CM

## 2017-04-09 ENCOUNTER — Ambulatory Visit
Admission: RE | Admit: 2017-04-09 | Discharge: 2017-04-09 | Disposition: A | Discharge status: Home / Self Care | Payer: BLUE CROSS/BLUE SHIELD | Source: Ambulatory Visit | Attending: Obstetrics & Gynecology | Admitting: Obstetrics & Gynecology

## 2017-04-09 ENCOUNTER — Telehealth: Payer: Self-pay | Admitting: Obstetrics & Gynecology

## 2017-04-09 ENCOUNTER — Other Ambulatory Visit: Payer: Self-pay | Admitting: Obstetrics & Gynecology

## 2017-04-09 DIAGNOSIS — R921 Mammographic calcification found on diagnostic imaging of breast: Secondary | ICD-10-CM

## 2017-04-09 DIAGNOSIS — R928 Other abnormal and inconclusive findings on diagnostic imaging of breast: Secondary | ICD-10-CM

## 2017-04-09 NOTE — Telephone Encounter (Signed)
Im assuming she needs Mammo results?

## 2017-04-09 NOTE — Progress Notes (Signed)
LM

## 2017-04-09 NOTE — Telephone Encounter (Signed)
Pt is calling for results. Please advise

## 2017-04-10 ENCOUNTER — Encounter: Payer: Self-pay | Admitting: Obstetrics & Gynecology

## 2017-04-10 NOTE — Progress Notes (Signed)
LM

## 2017-04-10 NOTE — Telephone Encounter (Signed)
Patient is calling to follow up with Dr. Tiburcio PeaHarris. Best time to reach patient is 12 pm- 1 pm on her lunch break. Thank you

## 2017-04-13 NOTE — Telephone Encounter (Signed)
Please advise 

## 2017-04-16 ENCOUNTER — Ambulatory Visit
Admission: RE | Admit: 2017-04-16 | Discharge: 2017-04-16 | Disposition: A | Discharge status: Home / Self Care | Payer: BLUE CROSS/BLUE SHIELD | Source: Ambulatory Visit | Attending: Obstetrics & Gynecology | Admitting: Obstetrics & Gynecology

## 2017-04-16 DIAGNOSIS — R921 Mammographic calcification found on diagnostic imaging of breast: Secondary | ICD-10-CM | POA: Insufficient documentation

## 2017-04-16 DIAGNOSIS — R928 Other abnormal and inconclusive findings on diagnostic imaging of breast: Secondary | ICD-10-CM | POA: Diagnosis not present

## 2017-04-16 DIAGNOSIS — D0512 Intraductal carcinoma in situ of left breast: Secondary | ICD-10-CM | POA: Insufficient documentation

## 2017-04-16 HISTORY — PX: BREAST BIOPSY: SHX20

## 2017-04-20 ENCOUNTER — Telehealth: Payer: Self-pay

## 2017-04-20 LAB — SURGICAL PATHOLOGY

## 2017-04-20 NOTE — Telephone Encounter (Signed)
Pt needs a surgical  Referral. The breast center called , pt is aware of results from breast biopsy.

## 2017-04-21 ENCOUNTER — Other Ambulatory Visit: Payer: Self-pay | Admitting: Obstetrics & Gynecology

## 2017-04-21 ENCOUNTER — Encounter: Payer: Self-pay | Admitting: *Deleted

## 2017-04-21 DIAGNOSIS — D0510 Intraductal carcinoma in situ of unspecified breast: Secondary | ICD-10-CM

## 2017-04-21 NOTE — Telephone Encounter (Signed)
Referral made.  I have d/w pt.

## 2017-04-22 ENCOUNTER — Encounter: Payer: Self-pay | Admitting: *Deleted

## 2017-04-22 ENCOUNTER — Inpatient Hospital Stay: Payer: Self-pay

## 2017-04-22 ENCOUNTER — Ambulatory Visit: Payer: BLUE CROSS/BLUE SHIELD | Admitting: General Surgery

## 2017-04-22 VITALS — BP 118/72 | HR 83 | Resp 14 | Ht 61.0 in | Wt 220.0 lb

## 2017-04-22 DIAGNOSIS — D0512 Intraductal carcinoma in situ of left breast: Secondary | ICD-10-CM

## 2017-04-22 NOTE — Patient Instructions (Signed)
The patient is aware to call back for any questions or concerns.  

## 2017-04-22 NOTE — Progress Notes (Signed)
Patient ID: Jane [name], female   DOB: May 16, 1970, 47 y.o.   MRN: [id number]  Chief Complaint  Patient presents with  . Breast Problem    HPI Jane [name] is a 47 y.o. female.  who presents for a breast evaluation. The most recent mammogram and left breast ultrasound was done on 04-16-17. Left breast biopsy was 04-16-17 showing DCIS. Patient does perform regular self breast checks and gets regular mammograms done.   She could not feel anything different in the breast. She had mastitis 3 times after her son. She is here with her husband, Jane [name] of 27 years. (CT at [facility]) She works at [employer].  HPI  Past Medical History:  Diagnosis Date  . Arthritis   . 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 Endometriosis   . Family history of breast cancer   . Family history of ovarian cancer 01/2017   genetic testing letter sent  . Hypertension   . Pre-eclampsia   . Spontaneous abortion     Past Surgical History:  Procedure Laterality Date  . BIOPSY ENDOMETRIAL    . BREAST BIOPSY Left 04/16/2017   Affirm Bx- path HIGH-GRADE DUCTAL CARCINOMA IN SITU WITH CALCIFICATIONS AND COMEDONECROSIS  . CESAREAN SECTION    . CHOLECYSTECTOMY  2004  . WISDOM TOOTH EXTRACTION      Family History  Problem Relation Age of Onset  . Kidney disease Father   . Colon cancer Maternal Grandfather   . Heart disease Maternal Grandfather   . Breast cancer Paternal Aunt 10  . Ovarian cancer Paternal Aunt 58  . Breast cancer Paternal Aunt 105  . Ovarian cancer Paternal Aunt 42  . Colon cancer Paternal Uncle 49    Social History Social History   Tobacco Use  . Smoking status: Never Smoker  . Smokeless tobacco: Never Used  Substance Use Topics  . Alcohol use: Yes    Comment: rare  . Drug use: No    Allergies  Allergen Reactions  . Erythromycin Rash  . Penicillins Rash  . Adhesive [Tape] Other (See Comments)    infection  . Amoxicillin Hives    Current  Outpatient Medications  Medication Sig Dispense Refill  . lisinopril (PRINIVIL,ZESTRIL) 20 MG tablet Take 20 mg by mouth daily.  3   No current facility-administered medications for this visit.     Review of Systems Review of Systems  Constitutional: Negative.   Respiratory: Negative.   Cardiovascular: Negative.     Blood pressure 118/72, pulse 83, resp. rate 14, height 5\' 1"  (1.549 m), weight 220 lb (99.8 kg), last menstrual period 06/06/2016, SpO2 97 %.  Physical Exam Physical Exam  Constitutional: She is oriented to person, place, and time. She appears well-developed and well-nourished.  HENT:  Mouth/Throat: Oropharynx is clear and moist.  Eyes: Conjunctivae are normal. No scleral icterus.  Neck: Neck supple.  Cardiovascular: Normal rate, regular rhythm and normal heart sounds.  Pulmonary/Chest: Effort normal and breath sounds normal. Right breast exhibits no inverted nipple, no mass, no nipple discharge, no skin change and no tenderness. Left breast exhibits no inverted nipple, no mass, no nipple discharge, no skin change and no tenderness.    Skin cyst sternal area. Left breast > right breast.   Lymphadenopathy:    She has no cervical adenopathy.    She has no axillary adenopathy.  Neurological: She is alert and oriented to person, place, and time.  Skin: Skin is warm and dry.  Psychiatric: Her behavior is normal.    Data Reviewed Ultrasound examination of the left breast was completed to determine if preoperative needle localization would be required.  At the 12 o'clock position of the left breast, 9 cm from the nipple and a depth of 1.6 cm a well-defined biopsy cavity measuring 0.7 x 0.73 x 1.41 cm is identified.  Adequate for intraoperative localization.  BI-RADS-6.  April 16, 2017 stereotactic biopsy:  DIAGNOSIS:  A. LEFT BREAST, UPPER OUTER QUADRANT; STEREOTACTIC BIOPSY:  - HIGH-GRADE DUCTAL CARCINOMA IN SITU WITH CALCIFICATIONS AND  COMEDONECROSIS.  Post  biopsy mammogram showed a few remaining calcifications in the area of biopsy.  Coil marker.  Left breast diagnostic mammogram dated April 09, 2017: 5 mm area of pleomorphic calcifications in the upper outer quadrant of the left breast for which stereotactic biopsy was recommended.  The second foci of microcalcifications in the deep central aspect of the left breast did not appear to undergo specific imaging during this study.  Screening mammograms of March 30, 2017: 2 foci of microcalcifications identified in the left breast.  Right breast was normal.  Assessment    High-grade DCIS involving the left breast    Plan  The patient's case will be reviewed on Monday, Jun 27, 2017 at which time special attention will be directed towards the second foci of microcalcifications.  Opportunity for second surgical opinion, and the opportunity to take adequate time to make an informed decision was reviewed.  Website information was provided as well a broader medical options was provided.  The patient was encouraged to call if she has any questions or desires to proceed to surgical scheduling.     HPI, Physical Exam, Assessment and Plan have been scribed under the direction and in the presence of Jane MayotteJeffrey W. Cristofer Yaffe, MD. Jane DaftMarsha Hatch, RN  I have completed the exam and reviewed the above documentation for accuracy and completeness.  I agree with the above.  [employer]gallery [employer] has been used and any errors in dictation or transcription are unintentional.  Jane [name], M.D., F.A.C.S.  Jane [name] 04/23/2017, 3:57 PM

## 2017-04-22 NOTE — Progress Notes (Signed)
  Oncology Nurse Navigator Documentation  Navigator Location: [facility] (04/22/17 1000)   )    Abnormal Finding Date: 04/09/17 (04/22/17 1000) Confirmed Diagnosis Date: 04/20/17 (04/22/17 1000)                   Barriers/Navigation Needs: Education (04/22/17 1000) Education: Newly Diagnosed Cancer Education (04/22/17 1000) Interventions: Education (04/22/17 1000)                          Patient newly diagnosed with breast cancer.  Her gynecologist has requested that he make the referral for surgical consult.  I see that the patient is scheduled to see Dr. Lemar LivingsByrnett on 04/22/17.  [facility] Rutherford NailByrnett's office and they will give patient our educational literature and inform her the navigator will call her next week.

## 2017-04-23 ENCOUNTER — Encounter: Payer: Self-pay | Admitting: General Surgery

## 2017-04-23 DIAGNOSIS — D0512 Intraductal carcinoma in situ of left breast: Secondary | ICD-10-CM

## 2017-04-23 HISTORY — DX: Intraductal carcinoma in situ of left breast: D05.12

## 2017-05-12 ENCOUNTER — Encounter: Payer: Self-pay | Admitting: General Surgery

## 2017-05-12 NOTE — Progress Notes (Signed)
Films were reviewed as there was discordance between the markers highlighting areas of concern on her screening mammograms and areas evaluated by spot compression imaging. Beckie SaltsSteven Reid, MD from radiology verbally reported to me yesterday that the markers on the screening studies are incorrect, and that both areas of concern in the breast are geographically adjacent to one another.  [facility].  No further follow up planned through this office.

## 2018-01-21 ENCOUNTER — Other Ambulatory Visit (HOSPITAL_COMMUNITY)
Admission: RE | Admit: 2018-01-21 | Discharge: 2018-01-21 | Disposition: A | Discharge status: Home / Self Care | Payer: BLUE CROSS/BLUE SHIELD | Source: Ambulatory Visit | Attending: Obstetrics & Gynecology | Admitting: Obstetrics & Gynecology

## 2018-01-21 ENCOUNTER — Ambulatory Visit (INDEPENDENT_AMBULATORY_CARE_PROVIDER_SITE_OTHER): Payer: BLUE CROSS/BLUE SHIELD | Admitting: Obstetrics & Gynecology

## 2018-01-21 ENCOUNTER — Encounter: Payer: Self-pay | Admitting: Obstetrics & Gynecology

## 2018-01-21 VITALS — BP 120/80 | Ht 61.0 in | Wt 225.0 lb

## 2018-01-21 DIAGNOSIS — Z1239 Encounter for other screening for malignant neoplasm of breast: Secondary | ICD-10-CM

## 2018-01-21 DIAGNOSIS — Z01419 Encounter for gynecological examination (general) (routine) without abnormal findings: Secondary | ICD-10-CM

## 2018-01-21 DIAGNOSIS — Z124 Encounter for screening for malignant neoplasm of cervix: Secondary | ICD-10-CM | POA: Insufficient documentation

## 2018-01-21 DIAGNOSIS — Z0000 Encounter for general adult medical examination without abnormal findings: Secondary | ICD-10-CM

## 2018-01-21 NOTE — Patient Instructions (Addendum)
PAP every three years Mammogram every 6 mos to a year w [facility] currently Colonoscopy every 10 years at age 47 Labs yearly (with PCP)

## 2018-01-21 NOTE — Progress Notes (Signed)
HPI:      [name] is a 47 y.o. [phone] who LMP was No LMP recorded. Patient is perimenopausal., she presents today for her annual examination. The patient has no complaints today. The patient is sexually active. Her last pap: approximate date 03/2015 and was normal and last mammogram: approximate date 03/2017 and was abnormal: DCIS, led to lumpectomy at [facility] 05/2017.  BRCA Neg 2019. The patient does perform self breast exams.  There is notable family history of breast or ovarian cancer in her family.  The patient has regular exercise: yes.  The patient denies current symptoms of depression.  Irreg periods, not a bother.  Recent 1 week discharge, no itch or burn.  GYN History: Contraception: vasectomy  PMHx: Past Medical History:  Diagnosis Date  . Arthritis   . 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 Endometriosis   . Family history of breast cancer   . Family history of ovarian cancer 01/2017   genetic testing letter sent  . Hypertension   . Pre-eclampsia   . Spontaneous abortion    Past Surgical History:  Procedure Laterality Date  . BIOPSY ENDOMETRIAL    . BREAST BIOPSY Left 04/16/2017   Affirm Bx- path HIGH-GRADE DUCTAL CARCINOMA IN SITU WITH CALCIFICATIONS AND COMEDONECROSIS  . CESAREAN SECTION    . CHOLECYSTECTOMY  2004  . WISDOM TOOTH EXTRACTION     Family History  Problem Relation Age of Onset  . Kidney disease Father   . Colon cancer Maternal Grandfather   . Heart disease Maternal Grandfather   . Breast cancer Paternal Aunt 1  . Ovarian cancer Paternal Aunt 75  . Breast cancer Paternal Aunt 53  . Ovarian cancer Paternal Aunt 61  . Colon cancer Paternal Uncle 37   Social History   Tobacco Use  . Smoking status: Never Smoker  . Smokeless tobacco: Never Used  Substance Use Topics  . Alcohol use: Yes    Comment: rare  . Drug use: No    Current Outpatient Medications:  .  lisinopril (PRINIVIL,ZESTRIL) 20 MG tablet, Take 20 mg by  mouth daily., Disp: , Rfl: 3 Allergies: Erythromycin; Penicillins; Adhesive [tape]; and Amoxicillin  Review of Systems  Constitutional: Negative for chills, fever and malaise/fatigue.  HENT: Negative for congestion, sinus pain and sore throat.   Eyes: Negative for blurred vision and pain.  Respiratory: Negative for cough and wheezing.   Cardiovascular: Negative for chest pain and leg swelling.  Gastrointestinal: Negative for abdominal pain, constipation, diarrhea, heartburn, nausea and vomiting.  Genitourinary: Negative for dysuria, frequency, hematuria and urgency.  Musculoskeletal: Negative for back pain, joint pain, myalgias and neck pain.  Skin: Negative for itching and rash.  Neurological: Negative for dizziness, tremors and weakness.  Endo/Heme/Allergies: Does not bruise/bleed easily.  Psychiatric/Behavioral: Negative for depression. The patient is not nervous/anxious and does not have insomnia.     Objective: BP 120/80   Ht '5\' 1"'  (1.549 m)   Wt 225 lb (102.1 kg)   BMI 42.51 kg/m   Filed Weights   01/21/18 0903  Weight: 225 lb (102.1 kg)   Body mass index is 42.51 kg/m. Physical Exam  Constitutional: She is oriented to person, place, and time. She appears well-developed and well-nourished. No distress.  Genitourinary: Rectum normal, vagina normal and uterus normal. Pelvic exam was performed with patient supine. There is no rash or lesion on the right labia. There is no rash or lesion on the left labia. Vagina  exhibits no lesion. No bleeding in the vagina. Right adnexum does not display mass and does not display tenderness. Left adnexum does not display mass and does not display tenderness. Cervix does not exhibit motion tenderness, lesion, friability or polyp.   Uterus is mobile and midaxial. Uterus is not enlarged or exhibiting a mass.  HENT:  Head: Normocephalic and atraumatic. Head is without laceration.  Right Ear: Hearing normal.  Left Ear: Hearing normal.  Nose: No  epistaxis.  No foreign bodies.  Mouth/Throat: Uvula is midline, oropharynx is clear and moist and mucous membranes are normal.  Eyes: Pupils are equal, round, and reactive to light.  Neck: Normal range of motion. Neck supple. No thyromegaly present.  Cardiovascular: Normal rate and regular rhythm. Exam reveals no gallop and no friction rub.  No murmur heard. Pulmonary/Chest: Effort normal and breath sounds normal. No respiratory distress. She has no wheezes. Right breast exhibits no mass, no skin change and no tenderness. Left breast exhibits no mass, no skin change and no tenderness.  Abdominal: Soft. Bowel sounds are normal. She exhibits no distension. There is no tenderness. There is no rebound.  Musculoskeletal: Normal range of motion.  Neurological: She is alert and oriented to person, place, and time. No cranial nerve deficit.  Skin: Skin is warm and dry.  Psychiatric: She has a normal mood and affect. Judgment normal.  Vitals reviewed.   Assessment:  ANNUAL EXAM 1. Annual physical exam   2. Screening for breast cancer   3. Morbid obesity ([place])   4. Screening for cervical cancer    Screening Plan:            1.  Cervical Screening-  Pap smear done today  2. Breast screening- Exam annually and mammogram>40 planned   3. Colonoscopy every 10 years, Hemoccult testing - after age 67  4. Labs managed by PCP  5. Counseling for contraception: vasectomy   6. Morbid obesity ([place]) Weight loss discussed    F/U  Return in about 1 year (around 01/22/2019) for Annual.  Barnett Applebaum, MD, [facility], [facility] 01/21/2018  9:26 AM

## 2018-01-22 LAB — CYTOLOGY - PAP
Adequacy: ABSENT
BACTERIAL VAGINITIS: NEGATIVE
Candida vaginitis: NEGATIVE
Diagnosis: NEGATIVE
HPV: NOT DETECTED

## 2018-08-15 ENCOUNTER — Encounter: Payer: Self-pay | Admitting: Emergency Medicine

## 2018-08-15 ENCOUNTER — Ambulatory Visit
Admission: EM | Admit: 2018-08-15 | Discharge: 2018-08-15 | Disposition: A | Discharge status: Home / Self Care | Payer: BLUE CROSS/BLUE SHIELD | Attending: Emergency Medicine | Admitting: Emergency Medicine

## 2018-08-15 ENCOUNTER — Other Ambulatory Visit: Payer: Self-pay

## 2018-08-15 DIAGNOSIS — H00011 Hordeolum externum right upper eyelid: Secondary | ICD-10-CM | POA: Diagnosis not present

## 2018-08-15 MED ORDER — BACITRACIN-POLYMYXIN B OP OINT: 1.0000 "application " | TOPICAL_OINTMENT | Freq: Four times a day (QID) | OPHTHALMIC | 0 refills | Status: AC

## 2018-08-15 NOTE — ED Provider Notes (Signed)
[facility]    CSN: [id number] Arrival date & time: 08/15/18  1049      History   Chief Complaint Chief Complaint  Patient presents with  . Eye Problem    right    HPI Jane [name] is a 48 y.o. female presenting with right upper eyelid pain and swelling. Pt has had similar issue with left upper and lower eyelid in the past 2 weeks, but those has since resolved. Pt's home management of eyelid massage is not working in clearing the swelling to right upper eyelid. Pt denies foreign body sensation, eye pain, or facial pain.   Past Medical History:  Diagnosis Date  . Arthritis   . 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 Endometriosis   . Family history of breast cancer   . Family history of ovarian cancer 01/2017   genetic testing letter sent  . Hypertension   . Pre-eclampsia   . Spontaneous abortion     Patient Active Problem List   Diagnosis Date Noted  . 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 Annual physical exam 01/19/2017  . Morbid obesity (HCC) 01/19/2017    Past Surgical History:  Procedure Laterality Date  . BIOPSY ENDOMETRIAL    . BREAST BIOPSY Left 04/16/2017   Affirm Bx- path HIGH-GRADE DUCTAL CARCINOMA IN SITU WITH CALCIFICATIONS AND COMEDONECROSIS  . CESAREAN SECTION    . CHOLECYSTECTOMY  2004  . WISDOM TOOTH EXTRACTION      OB History    Gravida  2   Para  1   Term      Preterm  1   AB  1   Living  1     SAB  1   TAB      Ectopic      Multiple      Live Births  1        Obstetric Comments  1st Menstrual Cycle:  11  1st Pregnancy:  29          Home Medications    Prior to Admission medications   Medication Sig Start Date End Date Taking? Authorizing Provider  lisinopril (PRINIVIL,ZESTRIL) 20 MG tablet Take 20 mg by mouth daily. 11/19/16  Yes [provider]  bacitracin-polymyxin b, ophth, (POLYSPORIN) OINT Place 1 application into the right eye 4 (four)  times daily. 08/15/18   Bailey MechBenjamin, Navpreet Szczygiel, NP    Family History Family History  Problem Relation Age of Onset  . Kidney disease Father   . Colon cancer Maternal Grandfather   . Heart disease Maternal Grandfather   . Breast cancer Paternal Aunt [id number]  . Ovarian cancer Paternal Aunt 7160  . Breast cancer Paternal Aunt 3760  . Ovarian cancer Paternal Aunt 5560  . Colon cancer Paternal Uncle [id number]    Social History Social History   Tobacco Use  . Smoking status: Never Smoker  . Smokeless tobacco: Never Used  Substance Use Topics  . Alcohol use: Yes    Comment: rare  . Drug use: No     Allergies   Erythromycin, Penicillins, Adhesive [tape], and Amoxicillin   Review of Systems Review of Systems  Eyes: Positive for discharge. Negative for photophobia, pain, redness, itching and visual disturbance.  All other systems reviewed and are negative.    Physical Exam Triage Vital Signs ED Triage Vitals  Enc Vitals Group     BP 08/15/18 1130 131/83     Pulse Rate 08/15/18  1130 63     Resp 08/15/18 1130 16     Temp 08/15/18 1130 98.2 F (36.8 C)     Temp Source 08/15/18 1130 Oral     SpO2 08/15/18 1130 99 %     Weight 08/15/18 1128 200 lb (90.7 kg)     Height 08/15/18 1128 5\' 1"  (1.549 m)     Head Circumference --      Peak Flow --      Pain Score 08/15/18 1127 7     Pain Loc --      Pain Edu? --      Excl. in Agenda? --    No data found.  Updated Vital Signs BP 131/83 (BP Location: Right Arm)   Pulse 63   Temp 98.2 F (36.8 C) (Oral)   Resp 16   Ht 5\' 1"  (1.549 m)   Wt 200 lb (90.7 kg)   SpO2 99%   BMI 37.79 kg/m   Visual Acuity Right Eye Distance: 20/40 uncorrected Left Eye Distance: 20/30 uncorrected Bilateral Distance: 20/25 uncorrected     Physical Exam Constitutional:      Appearance: Normal appearance.  Eyes:     General: Vision grossly intact.        Right eye: Discharge and hordeolum present.     Extraocular Movements: Extraocular movements intact.      Pupils: Pupils are equal, round, and reactive to light.     Comments: Erythema and swelling to upper lid. No pain with eye movements.   Neurological:     Mental Status: She is alert.      UC Treatments / Results  Labs (all labs ordered are listed, but only abnormal results are displayed) Labs Reviewed - No data to display  EKG None  Radiology No results found.  Procedures Procedures (including critical care time)  Medications Ordered in UC Medications - No data to display  Initial Impression / Assessment and Plan / UC Course  I have reviewed the triage vital signs and the nursing notes.  Pertinent labs & imaging results that were available during my care of the patient were reviewed by me and considered in my medical decision making (see chart for details).     Pt presenting with pain, swelling and erythema to right upper eyelid. Pt diagnosed with hordeolum and treated as such with polysporin ophthalmic four times a day for 7-10 days. Eye and hand hygrine  reviewed. All questions answered and all concerns addressed.    Final Clinical Impressions(s) / UC Diagnoses   Final diagnoses:  Hordeolum externum of right upper eyelid   Discharge Instructions   None    ED Prescriptions    Medication Sig Dispense Auth. Provider   bacitracin-polymyxin b, ophth, (POLYSPORIN) OINT Place 1 application into the right eye 4 (four) times daily. 14 g Gertie Baron, NP      Gertie Baron, DNP, NP-c    Gertie Baron, NP 08/15/18 1336

## 2018-08-15 NOTE — ED Triage Notes (Signed)
Patient c/o red swollen bump on her right eye that started on Monday.  Patient denies fevers.

## 2018-09-07 ENCOUNTER — Other Ambulatory Visit: Payer: Self-pay | Admitting: Obstetrics & Gynecology

## 2018-09-13 ENCOUNTER — Other Ambulatory Visit: Payer: Self-pay

## 2018-09-13 DIAGNOSIS — Z20822 Contact with and (suspected) exposure to covid-19: Secondary | ICD-10-CM

## 2018-09-15 LAB — NOVEL CORONAVIRUS, NAA: SARS-CoV-2, NAA: NOT DETECTED

## 2019-01-28 ENCOUNTER — Encounter: Payer: Self-pay | Admitting: Obstetrics & Gynecology

## 2019-01-28 ENCOUNTER — Ambulatory Visit (INDEPENDENT_AMBULATORY_CARE_PROVIDER_SITE_OTHER): Payer: BC Managed Care – PPO | Admitting: Obstetrics & Gynecology

## 2019-01-28 ENCOUNTER — Other Ambulatory Visit: Payer: Self-pay

## 2019-01-28 VITALS — BP 130/80 | Ht 61.0 in | Wt 218.0 lb

## 2019-01-28 DIAGNOSIS — Z01419 Encounter for gynecological examination (general) (routine) without abnormal findings: Secondary | ICD-10-CM

## 2019-01-28 NOTE — Patient Instructions (Addendum)
PAP every three years Mammogram every year Colonoscopy every 10 years starting next year Labs yearly (with PCP)

## 2019-01-28 NOTE — Progress Notes (Signed)
HPI:      Jane [name] is a 48 y.o. [phone] who LMP was No LMP recorded. Patient is perimenopausal., she presents today for her annual examination. The patient has no complaints today. The patient is sexually active. Her last pap: approximate date 2019 and was normal and last mammogram: approximate date 08/2018 and was normal and is beeing followed for DCIS of breast dx 2018. The patient does perform self breast exams.  There is notable family history of breast or ovarian cancer in her family.  She is BRCA NEG.  The patient has regular exercise: yes.  The patient denies current symptoms of depression.    GYN History: Contraception: vasectomy  PMHx: Past Medical History:  Diagnosis Date  . Arthritis   . 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 Endometriosis   . Family history of breast cancer   . Family history of ovarian cancer 01/2017   genetic testing letter sent  . Hypertension   . Pre-eclampsia   . Spontaneous abortion    Past Surgical History:  Procedure Laterality Date  . BIOPSY ENDOMETRIAL    . BREAST BIOPSY Left 04/16/2017   Affirm Bx- path HIGH-GRADE DUCTAL CARCINOMA IN SITU WITH CALCIFICATIONS AND COMEDONECROSIS  . CESAREAN SECTION    . CHOLECYSTECTOMY  2004  . WISDOM TOOTH EXTRACTION     Family History  Problem Relation Age of Onset  . Kidney disease Father   . Colon cancer Maternal Grandfather   . Heart disease Maternal Grandfather   . Breast cancer Paternal Aunt 83  . Ovarian cancer Paternal Aunt 68  . Breast cancer Paternal Aunt 22  . Ovarian cancer Paternal Aunt 79  . Colon cancer Paternal Uncle 50   Social History   Tobacco Use  . Smoking status: Never Smoker  . Smokeless tobacco: Never Used  Substance Use Topics  . Alcohol use: Yes    Comment: rare  . Drug use: No    Current Outpatient Medications:  .  lisinopril (PRINIVIL,ZESTRIL) 20 MG tablet, Take 20 mg by mouth daily., Disp: , Rfl: 3 .  bacitracin-polymyxin b,  ophth, (POLYSPORIN) OINT, Place 1 application into the right eye 4 (four) times daily. (Patient not taking: Reported on 01/28/2019), Disp: 14 g, Rfl: 0 Allergies: Erythromycin, Penicillins, Adhesive [tape], and Amoxicillin  Review of Systems  Constitutional: Negative for chills, fever and malaise/fatigue.  HENT: Negative for congestion, sinus pain and sore throat.   Eyes: Negative for blurred vision and pain.  Respiratory: Negative for cough and wheezing.   Cardiovascular: Negative for chest pain and leg swelling.  Gastrointestinal: Negative for abdominal pain, constipation, diarrhea, heartburn, nausea and vomiting.  Genitourinary: Negative for dysuria, frequency, hematuria and urgency.  Musculoskeletal: Negative for back pain, joint pain, myalgias and neck pain.  Skin: Negative for itching and rash.  Neurological: Negative for dizziness, tremors and weakness.  Endo/Heme/Allergies: Does not bruise/bleed easily.  Psychiatric/Behavioral: Negative for depression. The patient is not nervous/anxious and does not have insomnia.   All other systems reviewed and are negative.   Objective: BP 130/80   Ht 5' 1" (1.549 m)   Wt 218 lb (98.9 kg)   BMI 41.19 kg/m   Filed Weights   01/28/19 0807  Weight: 218 lb (98.9 kg)   Body mass index is 41.19 kg/m. Physical Exam Constitutional:      General: She is not in acute distress.    Appearance: She is well-developed.  Genitourinary:     Pelvic  exam was performed with patient supine.     Vagina, uterus and rectum normal.     No lesions in the vagina.     No vaginal bleeding.     No cervical motion tenderness, friability, lesion or polyp.     Uterus is mobile.     Uterus is not enlarged.     No uterine mass detected.    Uterus is midaxial.     No right or left adnexal mass present.     Right adnexa not tender.     Left adnexa not tender.  HENT:     Head: Normocephalic and atraumatic. No laceration.     Right Ear: Hearing normal.     Left  Ear: Hearing normal.     Mouth/Throat:     Pharynx: Uvula midline.  Eyes:     Pupils: Pupils are equal, round, and reactive to light.  Neck:     Thyroid: No thyromegaly.  Cardiovascular:     Rate and Rhythm: Normal rate and regular rhythm.     Heart sounds: No murmur. No friction rub. No gallop.   Pulmonary:     Effort: Pulmonary effort is normal. No respiratory distress.     Breath sounds: Normal breath sounds. No wheezing.  Chest:     Breasts:        Right: No mass, skin change or tenderness.        Left: No mass, skin change or tenderness.  Abdominal:     General: Bowel sounds are normal. There is no distension.     Palpations: Abdomen is soft.     Tenderness: There is no abdominal tenderness. There is no rebound.  Musculoskeletal:        General: Normal range of motion.     Cervical back: Normal range of motion and neck supple.  Neurological:     Mental Status: She is alert and oriented to person, place, and time.     Cranial Nerves: No cranial nerve deficit.  Skin:    General: Skin is warm and dry.  Psychiatric:        Judgment: Judgment normal.  Vitals reviewed.     Assessment:  ANNUAL EXAM 1. Women's annual routine gynecological examination      Screening Plan:            1.  Cervical Screening-  Pap smear schedule reviewed with patient  2. Breast screening- Exam annually and mammogram>40 planned   3. Colonoscopy every 10 years to start next year, Hemoccult testing - after age 57  4. Labs managed by PCP  5. Counseling for contraception: vasectomy   6. Monitor night sweats, discussed tx options if worsen Also, would plan to avoid HRT if possible due to breast cancer hx    F/U  Return in about 1 year (around 01/28/2020) for Annual.  Barnett Applebaum, MD, [facility], [facility] 01/28/2019  8:25 AM

## 2019-10-10 ENCOUNTER — Other Ambulatory Visit: Payer: Self-pay | Admitting: Orthopedic Surgery

## 2019-10-10 DIAGNOSIS — M5416 Radiculopathy, lumbar region: Secondary | ICD-10-CM

## 2019-10-26 ENCOUNTER — Ambulatory Visit: Admission: RE | Admit: 2019-10-26 | Payer: BC Managed Care – PPO | Source: Ambulatory Visit

## 2020-01-06 HISTORY — PX: BACK SURGERY: SHX140

## 2020-02-07 ENCOUNTER — Encounter: Payer: Self-pay | Admitting: Obstetrics & Gynecology

## 2020-02-07 ENCOUNTER — Ambulatory Visit (INDEPENDENT_AMBULATORY_CARE_PROVIDER_SITE_OTHER): Payer: BC Managed Care – PPO | Admitting: Obstetrics & Gynecology

## 2020-02-07 ENCOUNTER — Other Ambulatory Visit: Payer: Self-pay

## 2020-02-07 VITALS — BP 130/82 | Ht 61.0 in | Wt 226.0 lb

## 2020-02-07 DIAGNOSIS — Z01419 Encounter for gynecological examination (general) (routine) without abnormal findings: Secondary | ICD-10-CM

## 2020-02-07 DIAGNOSIS — Z1211 Encounter for screening for malignant neoplasm of colon: Secondary | ICD-10-CM

## 2020-02-07 NOTE — Patient Instructions (Signed)
PAP every three years Mammogram every year    Call [phone] to schedule at [place] Colonoscopy every 10 years Labs yearly (with PCP)  Thank you for choosing [facility]. As part of our ongoing efforts to improve patient experience, we would appreciate your feedback. Please fill out the short survey that you will receive by mail or MyChart. Your opinion is important to us! - Dr. Aiyana Stegmann  Recommendations to boost your immunity to prevent illness such as viral flu and colds, including covid19, are as follows:       - - -  Vitamin K2 and Vitamin D3  - - - Take Vitamin K2 at 200-300 mcg daily (usually 2-3 pills daily of the over the counter formulation). Take Vitamin D3 at 3000-4000 U daily (usually 3-4 pills daily of the over the counter formulation). Studies show that these two at high normal levels in your system are very effective in keeping your immunity so strong and protective that you will be unlikely to contract viral illness such as those listed above.  Dr Jaquis Picklesimer  

## 2020-02-07 NOTE — Progress Notes (Signed)
HPI:      [name] is a 49 y.o. [phone] who LMP was in the past, she presents today for her annual examination.  The patient has no complaints today. The patient is sexually active. Herlast pap: approximate date 2019 and was normal and last mammogram: approximate date 08/2019 (thru breast surgeon and at [facility]) and was normal.  The patient does perform self breast exams.  There is notable personal (dx DCIS 2018) and family history of breast cancer in her family. The patient is not taking hormone replacement therapy. Patient denies post-menopausal vaginal bleeding.   The patient has regular exercise: yes. The patient denies current symptoms of depression.    GYN Hx: Vasec for contraception   PMHx: Past Medical History:  Diagnosis Date  . Arthritis   . 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 Endometriosis   . Family history of breast cancer   . Family history of ovarian cancer 01/2017   genetic testing letter sent  . Hypertension   . Pre-eclampsia   . Spontaneous abortion    Past Surgical History:  Procedure Laterality Date  . BACK SURGERY  01/06/2020  . BIOPSY ENDOMETRIAL    . BREAST BIOPSY Left 04/16/2017   Affirm Bx- path HIGH-GRADE DUCTAL CARCINOMA IN SITU WITH CALCIFICATIONS AND COMEDONECROSIS  . CESAREAN SECTION    . CHOLECYSTECTOMY  2004  . WISDOM TOOTH EXTRACTION     Family History  Problem Relation Age of Onset  . Kidney disease Father   . Colon cancer Maternal Grandfather   . Heart disease Maternal Grandfather   . Breast cancer Paternal Aunt 92  . Ovarian cancer Paternal Aunt 72  . Breast cancer Paternal Aunt 26  . Ovarian cancer Paternal Aunt 23  . Colon cancer Paternal Uncle 89   Social History   Tobacco Use  . Smoking status: Never Smoker  . Smokeless tobacco: Never Used  Vaping Use  . Vaping Use: Never used  Substance Use Topics  . Alcohol use: Yes    Comment: rare  . Drug use: No    Current Outpatient Medications:  .   lisinopril (PRINIVIL,ZESTRIL) 20 MG tablet, Take 20 mg by mouth daily., Disp: , Rfl: 3 .  bacitracin-polymyxin b, ophth, (POLYSPORIN) OINT, Place 1 application into the right eye 4 (four) times daily. (Patient not taking: No sig reported), Disp: 14 g, Rfl: 0 Allergies: Erythromycin, Penicillins, Adhesive [tape], and Amoxicillin  Review of Systems  Constitutional: Negative for chills, fever and malaise/fatigue.  HENT: Negative for congestion, sinus pain and sore throat.   Eyes: Negative for blurred vision and pain.  Respiratory: Negative for cough and wheezing.   Cardiovascular: Negative for chest pain and leg swelling.  Gastrointestinal: Negative for abdominal pain, constipation, diarrhea, heartburn, nausea and vomiting.  Genitourinary: Negative for dysuria, frequency, hematuria and urgency.  Musculoskeletal: Negative for back pain, joint pain, myalgias and neck pain.  Skin: Negative for itching and rash.  Neurological: Negative for dizziness, tremors and weakness.  Endo/Heme/Allergies: Does not bruise/bleed easily.  Psychiatric/Behavioral: Negative for depression. The patient is not nervous/anxious and does not have insomnia.     Objective: BP 130/82   Ht 5\' 1"  (1.549 m)   Wt 226 lb (102.5 kg)   BMI 42.70 kg/m   Filed Weights   02/07/20 0801  Weight: 226 lb (102.5 kg)   Body mass index is 42.7 kg/m. Physical Exam Constitutional:      General: She is not in acute distress.  Appearance: She is well-developed and well-nourished.  Genitourinary:     Vagina, uterus and rectum normal.     There is no rash or lesion on the right labia.     There is no rash or lesion on the left labia.    No lesions in the vagina.     No vaginal bleeding.      Right Adnexa: not tender and no mass present.    Left Adnexa: not tender and no mass present.    No cervical motion tenderness, friability, lesion or polyp.     Uterus is mobile.     Uterus is not enlarged.     No uterine mass  detected.    Uterus is midaxial.     Pelvic exam was performed with patient supine.  Breasts:     Right: No mass, skin change or tenderness.     Left: No mass, skin change or tenderness.    HENT:     Head: Normocephalic and atraumatic. No laceration.     Right Ear: Hearing normal.     Left Ear: Hearing normal.     Nose: No epistaxis or foreign body.     Mouth/Throat:     Mouth: Oropharynx is clear and moist and mucous membranes are normal.     Pharynx: Uvula midline.  Eyes:     Pupils: Pupils are equal, round, and reactive to light.  Neck:     Thyroid: No thyromegaly.  Cardiovascular:     Rate and Rhythm: Normal rate and regular rhythm.     Heart sounds: No murmur heard. No friction rub. No gallop.   Pulmonary:     Effort: Pulmonary effort is normal. No respiratory distress.     Breath sounds: Normal breath sounds. No wheezing.  Abdominal:     General: Bowel sounds are normal. There is no distension.     Palpations: Abdomen is soft.     Tenderness: There is no abdominal tenderness. There is no rebound.  Musculoskeletal:        General: Normal range of motion.     Cervical back: Normal range of motion and neck supple.  Neurological:     Mental Status: She is alert and oriented to person, place, and time.     Cranial Nerves: No cranial nerve deficit.  Skin:    General: Skin is warm and dry.  Psychiatric:        Mood and Affect: Mood and affect normal.        Judgment: Judgment normal.  Vitals reviewed.     Assessment: Annual Exam 1. Women's annual routine gynecological examination   2. Screen for colon cancer     Plan:            1.  Cervical Screening-  Pap smear schedule reviewed with patient, due 2022  2. Breast screening- Exam annually and mammogram scheduled (usually in summer thru [facility] and her breast surgeon)  3. Colonoscopy every 10 years; has appt to schedule in Jan, Hemoccult testing after age 42  4. Labs managed by PCP  5. Counseling for hormonal  therapy: none              6. FRAX - FRAX score for assessing the 10 year probability for fracture calculated and discussed today.  Based on age and score today, DEXA is not currently scheduled.    F/U  Return in about 1 year (around 02/06/2021) for Annual.  Annamarie Major, MD, [facility], [facility] 02/07/2020  8:04 AM

## 2020-10-16 ENCOUNTER — Other Ambulatory Visit: Payer: Self-pay

## 2020-10-16 ENCOUNTER — Ambulatory Visit
Admission: EM | Admit: 2020-10-16 | Discharge: 2020-10-16 | Disposition: A | Discharge status: Home / Self Care | Payer: BC Managed Care – PPO | Attending: Physician Assistant | Admitting: Physician Assistant

## 2020-10-16 DIAGNOSIS — Z881 Allergy status to other antibiotic agents status: Secondary | ICD-10-CM | POA: Diagnosis not present

## 2020-10-16 DIAGNOSIS — Z880 Allergy status to penicillin: Secondary | ICD-10-CM | POA: Insufficient documentation

## 2020-10-16 DIAGNOSIS — R0981 Nasal congestion: Secondary | ICD-10-CM | POA: Insufficient documentation

## 2020-10-16 DIAGNOSIS — J069 Acute upper respiratory infection, unspecified: Secondary | ICD-10-CM | POA: Insufficient documentation

## 2020-10-16 DIAGNOSIS — Z28311 Partially vaccinated for covid-19: Secondary | ICD-10-CM | POA: Insufficient documentation

## 2020-10-16 DIAGNOSIS — Z20822 Contact with and (suspected) exposure to covid-19: Secondary | ICD-10-CM | POA: Diagnosis not present

## 2020-10-16 DIAGNOSIS — H9203 Otalgia, bilateral: Secondary | ICD-10-CM | POA: Diagnosis not present

## 2020-10-16 DIAGNOSIS — Z888 Allergy status to other drugs, medicaments and biological substances status: Secondary | ICD-10-CM | POA: Diagnosis not present

## 2020-10-16 LAB — SARS CORONAVIRUS 2 (TAT 6-24 HRS): SARS Coronavirus 2: NEGATIVE

## 2020-10-16 MED ORDER — FLUTICASONE PROPIONATE 50 MCG/ACT NA SUSP: 1.0000 | Freq: Two times a day (BID) | NASAL | 0 refills | Status: AC

## 2020-10-16 MED ORDER — PSEUDOEPH-BROMPHEN-DM 30-2-10 MG/5ML PO SYRP: 10.0000 mL | ORAL_SOLUTION | Freq: Four times a day (QID) | ORAL | 0 refills | Status: AC | PRN

## 2020-10-16 MED ORDER — SALINE SPRAY 0.65 % NA SOLN: 1.0000 | NASAL | 0 refills | Status: AC | PRN

## 2020-10-16 NOTE — ED Triage Notes (Signed)
Pt with "double earache" starting on Saturday. Nasal congestion started today. Clear drainage from nose but coughing up brownish secretions. Endorses facial pain and pressure and frontal headache soreness

## 2020-10-16 NOTE — ED Provider Notes (Signed)
[facility]    CSN: [id number] Arrival date & time: 10/16/20  [id number]      History   Chief Complaint Chief Complaint  Patient presents with   Otalgia   Facial Pain    HPI Jane [name] is a 50 y.o. female presenting for onset of nasal congestion, sinus pressure, headaches, bilateral ear pain, cough and fatigue 2-3 days ago.  She denies any fevers, body aches, chest pain, breathing difficulty, nausea/vomiting or diarrhea.  No sick contacts or known exposure COVID-19.  Vaccinated for COVID-19 x2.  Patient has been taking over-the-counter Claritin but has not used any nasal sprays or taking any decongestants.  She has no other complaints or concerns today.  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   Annual physical exam 01/19/2017   Morbid obesity (HCC) 01/19/2017    Past Surgical History:  Procedure Laterality Date   BACK SURGERY  01/06/2020   BIOPSY ENDOMETRIAL     BREAST BIOPSY Left 04/16/2017   Affirm Bx- path HIGH-GRADE DUCTAL CARCINOMA IN SITU WITH CALCIFICATIONS AND COMEDONECROSIS   CESAREAN SECTION     CHOLECYSTECTOMY  2004   WISDOM TOOTH EXTRACTION      OB History     Gravida  2   Para  1   Term      Preterm  1   AB  1   Living  1      SAB  1   IAB      Ectopic      Multiple      Live Births  1        Obstetric Comments  1st Menstrual Cycle:  11  1st Pregnancy:  29           Home Medications    Prior to Admission medications   Medication Sig Start Date End Date Taking? Authorizing Provider  brompheniramine-pseudoephedrine-DM 30-2-10 MG/5ML  syrup Take 10 mLs by mouth 4 (four) times daily as needed for up to 7 days. 10/16/20 10/23/20 Yes Eusebio FriendlyEaves, Zeppelin Commisso B, PA-C  fluticasone (FLONASE) 50 MCG/ACT nasal spray Place 1 spray into both nostrils 2 (two) times daily for 10 days. 10/16/20 10/26/20 Yes Eusebio FriendlyEaves, Imaya Duffy B, PA-C  sodium chloride (OCEAN) 0.65 % SOLN nasal spray Place 1 spray into both nostrils as needed for up to 7 days for congestion (q2hr prn nasal congestion). 10/16/20 10/23/20 Yes Eusebio FriendlyEaves, Loma [place] B, PA-C  bacitracin-polymyxin b, ophth, (POLYSPORIN) OINT Place 1 application into the right eye 4 (four) times daily. Patient not taking: No sig reported 08/15/18   Bailey MechBenjamin, Lunise, NP  Cholecalciferol 50 MCG (2000 UT) CAPS Take by mouth.    [provider]  lisinopril (PRINIVIL,ZESTRIL) 20 MG tablet Take 20 mg by mouth daily. 11/19/16   [provider]    Family History Family History  Problem Relation Age of Onset   Kidney disease Father    Colon cancer Maternal Grandfather    Heart disease Maternal Grandfather    Breast cancer Paternal Aunt 1645   Ovarian cancer Paternal Aunt  60   Breast cancer Paternal Aunt 70   Ovarian cancer Paternal Aunt 30   Colon cancer Paternal Uncle 52    Social History Social History   Tobacco Use   Smoking status: Never   Smokeless tobacco: Never  Vaping Use   Vaping Use: Never used  Substance Use Topics   Alcohol use: Yes    Comment: rare   Drug use: No     Allergies   Erythromycin, Penicillins, Adhesive [tape], and Amoxicillin   Review of Systems Review of Systems  Constitutional:  Positive for fatigue. Negative for chills, diaphoresis and fever.  HENT:  Positive for congestion, ear pain, rhinorrhea, sinus pressure and sore throat.   Respiratory:  Positive for cough. Negative for shortness of breath.   Gastrointestinal:  Negative for abdominal pain, nausea and vomiting.  Musculoskeletal:  Negative for arthralgias and myalgias.  Skin:  Negative for rash.  Neurological:   Positive for headaches. Negative for weakness.  Hematological:  Negative for adenopathy.    Physical Exam Triage Vital Signs ED Triage Vitals  Enc Vitals Group     BP 10/16/20 0855 (!) 142/92     Pulse Rate 10/16/20 0855 87     Resp 10/16/20 0855 17     Temp 10/16/20 0855 98.4 F (36.9 C)     Temp Source 10/16/20 0855 Oral     SpO2 10/16/20 0855 (!) 18 %     Weight 10/16/20 0853 210 lb (95.3 kg)     Height 10/16/20 0853 5\' 1"  (1.549 m)     Head Circumference --      Peak Flow --      Pain Score 10/16/20 0852 7     Pain Loc --      Pain Edu? --      Excl. in GC? --    No data found.  Updated Vital Signs BP (!) 142/92 (BP Location: Left Arm)   Pulse 87   Temp 98.4 F (36.9 C) (Oral)   Resp 17   Ht 5\' 1"  (1.549 m)   Wt 210 lb (95.3 kg)   LMP 12/05/2016   SpO2 98%   BMI 39.68 kg/m      Physical Exam Vitals and nursing note reviewed.  Constitutional:      General: She is not in acute distress.    Appearance: Normal appearance. She is obese. She is ill-appearing. She is not toxic-appearing.  HENT:     Head: Normocephalic and atraumatic.     Right Ear: Ear canal and external ear normal. A middle ear effusion is present.     Left Ear: Tympanic membrane, ear canal and external ear normal.     Nose: Congestion and rhinorrhea (large amount of light yellowish drainage) present.     Mouth/Throat:     Mouth: Mucous membranes are moist.     Pharynx: Oropharynx is clear. Posterior oropharyngeal erythema (mild with PND) present.  Eyes:     General: No scleral icterus.       Right eye: No discharge.        Left eye: No discharge.     Conjunctiva/sclera: Conjunctivae normal.  Cardiovascular:     Rate and Rhythm: Normal rate and regular rhythm.     Heart sounds: Normal heart sounds.  Pulmonary:     Effort: Pulmonary effort is normal. No respiratory distress.     Breath sounds: Normal breath sounds.  Musculoskeletal:     Cervical back: Neck supple.  Skin:  General:  Skin is dry.  Neurological:     General: No focal deficit present.     Mental Status: She is alert. Mental status is at baseline.     Motor: No weakness.     Gait: Gait normal.  Psychiatric:        Mood and Affect: Mood normal.        Behavior: Behavior normal.        Thought Content: Thought content normal.     UC Treatments / Results  Labs (all labs ordered are listed, but only abnormal results are displayed) Labs Reviewed  SARS CORONAVIRUS 2 (TAT 6-24 HRS)    EKG   Radiology No results found.  Procedures Procedures (including critical care time)  Medications Ordered in UC Medications - No data to display  Initial Impression / Assessment and Plan / UC Course  I have reviewed the triage vital signs and the nursing notes.  Pertinent labs & imaging results that were available during my care of the patient were reviewed by me and considered in my medical decision making (see chart for details).  50 year old female presenting for 2 to 3-day history of fatigue, sinus pressure, ear pain, nasal congestion and cough.  Vitals are all normal and stable.  She is ill-appearing but not toxic.  On exam she does have nasal congestion and large amount of light yellowish rhinorrhea.  Also has mild posterior pharyngeal erythema with PND.  Chest is clear to auscultation heart regular rate and rhythm.  PCR COVID test obtained.  Current CDC guidelines, isolation protocol and ED precautions reviewed with patient.  Suspect viral illness/viral sinusitis.  Treating at this time with Bromfed DM, Flonase nasal spray and nasal saline.  Also advised her to take Tylenol or Motrin as needed for discomfort.  Increase rest and fluids.  Follow-up for any worsening sinus pain or ear pain or symptoms continue after 10 days or if she develops fever, worsening cough or breathing issue.  ED precautions reviewed.  Work note given.  Final Clinical Impressions(s) / UC Diagnoses   Final diagnoses:  Acute upper  respiratory infection  Otalgia of both ears  Nasal congestion     Discharge Instructions      I have sent a cough syrup that has an antihistamine. Don't take any other antihistamines with this.  URI/COLD SYMPTOMS: Your exam today is consistent with a viral illness. Antibiotics are not indicated at this time. Use medications as directed, including cough syrup, nasal saline, and decongestants. Your symptoms should improve over the next few days and resolve within 7-10 days. Increase rest and fluids. F/u if symptoms worsen or predominate such as sore throat, ear pain, productive cough, shortness of breath, or if you develop high fevers or worsening fatigue over the next several days.    You have received COVID testing today either for positive exposure, concerning symptoms that could be related to COVID infection, screening purposes, or re-testing after confirmed positive.  Your test obtained today checks for active viral infection in the last 1-2 weeks. If your test is negative now, you can still test positive later. So, if you do develop symptoms you should either get re-tested and/or isolate x 5 days and then strict mask use x 5 days (unvaccinated) or mask use x 10 days (vaccinated). Please follow CDC guidelines.  While Rapid antigen tests come back in 15-20 minutes, send out PCR/molecular test results typically come back within 1-3 days. In the mean time, if you are symptomatic, assume  this could be a positive test and treat/monitor yourself as if you do have COVID.   We will call with test results if positive. Please download the MyChart app and set up a profile to access test results.   If symptomatic, go home and rest. Push fluids. Take Tylenol as needed for discomfort. Gargle warm salt water. Throat lozenges. Take Mucinex DM or Robitussin for cough. Humidifier in bedroom to ease coughing. Warm showers. Also review the COVID handout for more information.  COVID-19 INFECTION: The incubation  period of COVID-19 is approximately 14 days after exposure, with most symptoms developing in roughly 4-5 days. Symptoms may range in severity from mild to critically severe. Roughly 80% of those infected will have mild symptoms. People of any age may become infected with COVID-19 and have the ability to transmit the virus. The most common symptoms include: fever, fatigue, cough, body aches, headaches, sore throat, nasal congestion, shortness of breath, nausea, vomiting, diarrhea, changes in smell and/or taste.    COURSE OF ILLNESS Some patients may begin with mild disease which can progress quickly into critical symptoms. If your symptoms are worsening please call ahead to the Emergency Department and proceed there for further treatment. Recovery time appears to be roughly 1-2 weeks for mild symptoms and 3-6 weeks for severe disease.   GO IMMEDIATELY TO ER FOR FEVER YOU ARE UNABLE TO GET DOWN WITH TYLENOL, BREATHING PROBLEMS, CHEST PAIN, FATIGUE, LETHARGY, INABILITY TO EAT OR DRINK, ETC  QUARANTINE AND ISOLATION: To help decrease the spread of COVID-19 please remain isolated if you have COVID infection or are highly suspected to have COVID infection. This means -stay home and isolate to one room in the home if you live with others. Do not share a bed or bathroom with others while ill, sanitize and wipe down all countertops and keep common areas clean and disinfected. Stay home for 5 days. If you have no symptoms or your symptoms are resolving after 5 days, you can leave your house. Continue to wear a mask around others for 5 additional days. If you have been in close contact (within 6 feet) of someone diagnosed with COVID 19, you are advised to quarantine in your home for 14 days as symptoms can develop anywhere from 2-14 days after exposure to the virus. If you develop symptoms, you  must isolate.  Most current guidelines for COVID after exposure -unvaccinated: isolate 5 days and strict mask use x 5 days.  Test on day 5 is possible -vaccinated: wear mask x 10 days if symptoms do not develop -You do not necessarily need to be tested for COVID if you have + exposure and  develop symptoms. Just isolate at home x10 days from symptom onset During this global pandemic, CDC advises to practice social distancing, try to stay at least 14ft away from others at all times. Wear a face covering. Wash and sanitize your hands regularly and avoid going anywhere that is not necessary.  KEEP IN MIND THAT THE COVID TEST IS NOT 100% ACCURATE AND YOU SHOULD STILL DO EVERYTHING TO PREVENT POTENTIAL SPREAD OF VIRUS TO OTHERS (WEAR MASK, WEAR GLOVES, WASH HANDS AND SANITIZE REGULARLY). IF INITIAL TEST IS NEGATIVE, THIS MAY NOT MEAN YOU ARE DEFINITELY NEGATIVE. MOST ACCURATE TESTING IS DONE 5-7 DAYS AFTER EXPOSURE.   It is not advised by CDC to get re-tested after receiving a positive COVID test since you can still test positive for weeks to months after you have already cleared the virus.   *  If you have not been vaccinated for COVID, I strongly suggest you consider getting vaccinated as long as there are no contraindications.       ED Prescriptions     Medication Sig Dispense Auth. Provider   brompheniramine-pseudoephedrine-DM 30-2-10 MG/5ML syrup Take 10 mLs by mouth 4 (four) times daily as needed for up to 7 days. 150 mL Eusebio Friendly B, PA-C   fluticasone (FLONASE) 50 MCG/ACT nasal spray Place 1 spray into both nostrils 2 (two) times daily for 10 days. 1 g Eusebio Friendly B, PA-C   sodium chloride (OCEAN) 0.65 % SOLN nasal spray Place 1 spray into both nostrils as needed for up to 7 days for congestion (q2hr prn nasal congestion). 88 mL Shirlee Latch, PA-C      PDMP not reviewed this encounter.   Shirlee Latch, PA-C 10/16/20 [phone]

## 2020-10-16 NOTE — Discharge Instructions (Addendum)
I have sent a cough syrup that has an antihistamine. Don't take any other antihistamines with this.  URI/COLD SYMPTOMS: Your exam today is consistent with a viral illness. Antibiotics are not indicated at this time. Use medications as directed, including cough syrup, nasal saline, and decongestants. Your symptoms should improve over the next few days and resolve within 7-10 days. Increase rest and fluids. F/u if symptoms worsen or predominate such as sore throat, ear pain, productive cough, shortness of breath, or if you develop high fevers or worsening fatigue over the next several days.    You have received COVID testing today either for positive exposure, concerning symptoms that could be related to COVID infection, screening purposes, or re-testing after confirmed positive.  Your test obtained today checks for active viral infection in the last 1-2 weeks. If your test is negative now, you can still test positive later. So, if you do develop symptoms you should either get re-tested and/or isolate x 5 days and then strict mask use x 5 days (unvaccinated) or mask use x 10 days (vaccinated). Please follow CDC guidelines.  While Rapid antigen tests come back in 15-20 minutes, send out PCR/molecular test results typically come back within 1-3 days. In the mean time, if you are symptomatic, assume this could be a positive test and treat/monitor yourself as if you do have COVID.   We will call with test results if positive. Please download the MyChart app and set up a profile to access test results.   If symptomatic, go home and rest. Push fluids. Take Tylenol as needed for discomfort. Gargle warm salt water. Throat lozenges. Take Mucinex DM or Robitussin for cough. Humidifier in bedroom to ease coughing. Warm showers. Also review the COVID handout for more information.  COVID-19 INFECTION: The incubation period of COVID-19 is approximately 14 days after exposure, with most symptoms developing in roughly  4-5 days. Symptoms may range in severity from mild to critically severe. Roughly 80% of those infected will have mild symptoms. People of any age may become infected with COVID-19 and have the ability to transmit the virus. The most common symptoms include: fever, fatigue, cough, body aches, headaches, sore throat, nasal congestion, shortness of breath, nausea, vomiting, diarrhea, changes in smell and/or taste.    COURSE OF ILLNESS Some patients may begin with mild disease which can progress quickly into critical symptoms. If your symptoms are worsening please call ahead to the Emergency Department and proceed there for further treatment. Recovery time appears to be roughly 1-2 weeks for mild symptoms and 3-6 weeks for severe disease.   GO IMMEDIATELY TO ER FOR FEVER YOU ARE UNABLE TO GET DOWN WITH TYLENOL, BREATHING PROBLEMS, CHEST PAIN, FATIGUE, LETHARGY, INABILITY TO EAT OR DRINK, ETC  QUARANTINE AND ISOLATION: To help decrease the spread of COVID-19 please remain isolated if you have COVID infection or are highly suspected to have COVID infection. This means -stay home and isolate to one room in the home if you live with others. Do not share a bed or bathroom with others while ill, sanitize and wipe down all countertops and keep common areas clean and disinfected. Stay home for 5 days. If you have no symptoms or your symptoms are resolving after 5 days, you can leave your house. Continue to wear a mask around others for 5 additional days. If you have been in close contact (within 6 feet) of someone diagnosed with COVID 19, you are advised to quarantine in your home for 14 days as  symptoms can develop anywhere from 2-14 days after exposure to the virus. If you develop symptoms, you  must isolate.  Most current guidelines for COVID after exposure -unvaccinated: isolate 5 days and strict mask use x 5 days. Test on day 5 is possible -vaccinated: wear mask x 10 days if symptoms do not develop -You do not  necessarily need to be tested for COVID if you have + exposure and  develop symptoms. Just isolate at home x10 days from symptom onset During this global pandemic, CDC advises to practice social distancing, try to stay at least 28ft away from others at all times. Wear a face covering. Wash and sanitize your hands regularly and avoid going anywhere that is not necessary.  KEEP IN MIND THAT THE COVID TEST IS NOT 100% ACCURATE AND YOU SHOULD STILL DO EVERYTHING TO PREVENT POTENTIAL SPREAD OF VIRUS TO OTHERS (WEAR MASK, WEAR GLOVES, WASH HANDS AND SANITIZE REGULARLY). IF INITIAL TEST IS NEGATIVE, THIS MAY NOT MEAN YOU ARE DEFINITELY NEGATIVE. MOST ACCURATE TESTING IS DONE 5-7 DAYS AFTER EXPOSURE.   It is not advised by CDC to get re-tested after receiving a positive COVID test since you can still test positive for weeks to months after you have already cleared the virus.   *If you have not been vaccinated for COVID, I strongly suggest you consider getting vaccinated as long as there are no contraindications.
